# Patient Record
Sex: Male | Born: 1950 | Race: White | Hispanic: No | Marital: Married | Smoking: Current some day smoker
Health system: Southern US, Community
[De-identification: ages and names within clinical notes are randomized; demographics above are authoritative.]

## PROBLEM LIST (undated history)

## (undated) DIAGNOSIS — N183 Chronic kidney disease, stage 3 unspecified: Secondary | ICD-10-CM

## (undated) DIAGNOSIS — Z720 Tobacco use: Secondary | ICD-10-CM

## (undated) DIAGNOSIS — D573 Sickle-cell trait: Secondary | ICD-10-CM

## (undated) DIAGNOSIS — G4730 Sleep apnea, unspecified: Secondary | ICD-10-CM

## (undated) DIAGNOSIS — K449 Diaphragmatic hernia without obstruction or gangrene: Secondary | ICD-10-CM

## (undated) DIAGNOSIS — R5382 Chronic fatigue, unspecified: Secondary | ICD-10-CM

## (undated) DIAGNOSIS — M797 Fibromyalgia: Secondary | ICD-10-CM

## (undated) DIAGNOSIS — N181 Chronic kidney disease, stage 1: Secondary | ICD-10-CM

## (undated) DIAGNOSIS — G4700 Insomnia, unspecified: Secondary | ICD-10-CM

## (undated) DIAGNOSIS — C61 Malignant neoplasm of prostate: Secondary | ICD-10-CM

## (undated) DIAGNOSIS — E785 Hyperlipidemia, unspecified: Secondary | ICD-10-CM

## (undated) DIAGNOSIS — I129 Hypertensive chronic kidney disease with stage 1 through stage 4 chronic kidney disease, or unspecified chronic kidney disease: Secondary | ICD-10-CM

## (undated) DIAGNOSIS — E669 Obesity, unspecified: Secondary | ICD-10-CM

## (undated) HISTORY — DX: Chronic kidney disease, stage 3 (moderate): N18.3

## (undated) HISTORY — DX: Malignant neoplasm of prostate: C61

## (undated) HISTORY — DX: Tobacco use: Z72.0

## (undated) HISTORY — DX: Hyperlipidemia, unspecified: E78.5

## (undated) HISTORY — DX: Chronic kidney disease, stage 1: N18.1

## (undated) HISTORY — DX: Sickle-cell trait: D57.3

## (undated) HISTORY — DX: Obesity, unspecified: E66.9

## (undated) HISTORY — DX: Insomnia, unspecified: G47.00

## (undated) HISTORY — DX: Hypertensive chronic kidney disease with stage 1 through stage 4 chronic kidney disease, or unspecified chronic kidney disease: I12.9

## (undated) HISTORY — DX: Chronic fatigue, unspecified: R53.82

## (undated) HISTORY — DX: Chronic kidney disease, stage 3 unspecified: N18.30

## (undated) HISTORY — DX: Sleep apnea, unspecified: G47.30

## (undated) HISTORY — DX: Diaphragmatic hernia without obstruction or gangrene: K44.9

## (undated) HISTORY — DX: Fibromyalgia: M79.7

---

## 2002-09-08 DIAGNOSIS — C61 Malignant neoplasm of prostate: Secondary | ICD-10-CM

## 2002-09-08 HISTORY — DX: Malignant neoplasm of prostate: C61

## 2002-11-17 ENCOUNTER — Encounter: Admission: RE | Admit: 2002-11-17 | Discharge: 2002-11-17 | Payer: Self-pay | Admitting: Family Medicine

## 2002-11-17 ENCOUNTER — Encounter: Payer: Self-pay | Admitting: Family Medicine

## 2002-12-28 ENCOUNTER — Encounter: Admission: RE | Admit: 2002-12-28 | Discharge: 2002-12-28 | Payer: Self-pay | Admitting: Urology

## 2002-12-28 ENCOUNTER — Encounter: Payer: Self-pay | Admitting: Urology

## 2003-01-16 ENCOUNTER — Ambulatory Visit (HOSPITAL_COMMUNITY): Admission: RE | Admit: 2003-01-16 | Discharge: 2003-01-16 | Payer: Self-pay | Admitting: Gastroenterology

## 2003-01-27 ENCOUNTER — Inpatient Hospital Stay (HOSPITAL_COMMUNITY): Admission: RE | Admit: 2003-01-27 | Discharge: 2003-01-30 | Payer: Self-pay | Admitting: Urology

## 2003-01-27 ENCOUNTER — Encounter (INDEPENDENT_AMBULATORY_CARE_PROVIDER_SITE_OTHER): Payer: Self-pay | Admitting: Specialist

## 2005-01-31 ENCOUNTER — Observation Stay (HOSPITAL_COMMUNITY): Admission: RE | Admit: 2005-01-31 | Discharge: 2005-02-01 | Payer: Self-pay | Admitting: Urology

## 2006-03-27 ENCOUNTER — Encounter: Admission: RE | Admit: 2006-03-27 | Discharge: 2006-03-27 | Payer: Self-pay | Admitting: Cardiology

## 2006-03-30 ENCOUNTER — Ambulatory Visit (HOSPITAL_COMMUNITY): Admission: RE | Admit: 2006-03-30 | Discharge: 2006-03-30 | Payer: Self-pay | Admitting: Cardiology

## 2006-04-08 ENCOUNTER — Encounter: Admission: RE | Admit: 2006-04-08 | Discharge: 2006-04-08 | Payer: Self-pay | Admitting: Cardiology

## 2006-04-13 ENCOUNTER — Ambulatory Visit (HOSPITAL_COMMUNITY): Admission: RE | Admit: 2006-04-13 | Discharge: 2006-04-13 | Payer: Self-pay | Admitting: Cardiology

## 2006-06-16 ENCOUNTER — Ambulatory Visit: Payer: Self-pay | Admitting: Pulmonary Disease

## 2006-08-04 ENCOUNTER — Ambulatory Visit: Payer: Self-pay | Admitting: Pulmonary Disease

## 2007-05-04 ENCOUNTER — Encounter: Admission: RE | Admit: 2007-05-04 | Discharge: 2007-05-04 | Payer: Self-pay | Admitting: Family Medicine

## 2010-09-16 ENCOUNTER — Encounter
Admission: RE | Admit: 2010-09-16 | Discharge: 2010-09-16 | Payer: Self-pay | Source: Home / Self Care | Attending: Family Medicine | Admitting: Family Medicine

## 2011-01-24 NOTE — Op Note (Signed)
   NAME:  Arthur Hardy, Arthur Hardy NO.:  [id number]   MEDICAL RECORD NO.:  [id number]                   PATIENT TYPE:  AMB   LOCATION:  ENDO                                 FACILITY:  [facility]   PHYSICIAN:  James L. Malon Kindle., M.D.          DATE OF BIRTH:  [birth year]   DATE OF PROCEDURE:  01/16/2003  DATE OF DISCHARGE:                                 OPERATIVE REPORT   PROCEDURE:  Esophagogastroduodenoscopy.   INDICATIONS FOR PROCEDURE:  Chronic esophageal reflux without any previous  endoscopy.   DESCRIPTION OF PROCEDURE:  The procedure had been explained to the patient  and consent obtained. With the patient in the left lateral decubitus  position, the Olympus scope was inserted and advanced. The stomach was  entered, pylorus identified and passed. The duodenum including the bulb and  second portion were seen well and was entirely normal. The scope was  withdrawn back in the bulb, the bulb was normal with no ulceration or  inflammation. The pyloric channel was normal. The antrum and body of the  stomach were seen well and were normal with no ulceration. The fundus and  cardia were seen in the retroflexed view and were normal. The diaphragmatic  hiatus was located 44 cm and the Z line was clearly identifiable at 36 cm.  The patient thus had an 8 cm hiatal hernia. The distal esophagus was  endoscopically normal, no evidence of Barrett's esophagus, ulceration, etc.  The proximal esophagus was seen well upon removal and seen to be normal. The  patient tolerated the procedure well.   ASSESSMENT:  Hiatal hernia and gastroesophageal reflux disease without any  evidence of Barrett's esophagus on endoscopy.   PLAN:  1. Will proceed with colonoscopy at this time as planned.  2. Will continue on the Prilosec.  3. Will give a reflux sheet.  4. See back in the office in six months for followup and discuss further     options.            James L. Malon Kindle., M.D.    Waldron Session  D:  01/16/2003  T:  01/16/2003  Job:  [id number]   cc:   Donia Guiles, M.D.  [address] [place]  [place] [address]  Fax: [phone]

## 2011-01-24 NOTE — Discharge Summary (Signed)
NAME:  Arthur Hardy, Arthur Hardy NO.:  [id number]   MEDICAL RECORD NO.:  [id number]                   PATIENT TYPE:  INP   LOCATION:  0372                                 FACILITY:  [facility]   PHYSICIAN:  Mark C. Vernie Ammons, M.D.               DATE OF BIRTH:  [birth year]   DATE OF ADMISSION:  01/27/2003  DATE OF DISCHARGE:  01/30/2003                                 DISCHARGE SUMMARY   PRINCIPAL DIAGNOSIS:  Adenocarcinoma of the prostate.   OTHER DIAGNOSES:  1. Postoperative anemia.  2. Gastroesophageal reflux.  3. Fibromyalgia.   MAJOR OPERATION:  Radical retropubic prostatectomy and bilateral lymph node  dissection (pathology pending).   DISCHARGE MEDICATIONS:  1. Will be unchanged from admission, which include:     A. Protonix 20 mg.     B. Nortriptyline 75 mg h.s.     C. Fluoxetine 40 mg.  2. In addition to that he will be given:     A. Prescription for Levaquin 250 mg to be taken once a day, #16.     B. Tylox for pain, #48.     C. Colace 100 mg q.12h., #30.   FOLLOW-UP:  Will be in my office in one week for skin staple removal.   ACTIVITY:  Will be limited to no heavy lifting, vigorous activity, up and  down stairs, or driving.   DIET:  Will be unrestricted.   CONDITION UPON DISCHARGE:  Improved, stable.   BRIEF HISTORY:  The patient is a 60 year old white male who was seen for an  elevated PSA of 6.4.  His prostate was subtlely irregular but not suspicious  to exam.  He was a transrectal ultrasound and biopsy of his prostate which  revealed a 26-gram prostate that had focal glanular atypia suspicious for  microscopic focus of carcinoma on the right side.  The left side had  definite adenocarcinoma, Gleason score 6 in 10% of the biopsy.  He was  admitted for elective radical retropubic prostatectomy.  The remainder of  his history and physical is noted on the chart and will not be redictated  here.   HOSPITAL COURSE:  On Jan 27, 2003, he  underwent radical retropubic  prostatectomy and bilateral pelvic lymph node dissection with no  complication.  Postoperatively his hemoglobin and hematocrit had fallen  slightly to 10 and 29 respectively.  The following day they were 9.6 and 28,  where they have remained stable.  He is not having any dizziness or  orthostasis.   Throughout his hospital course he did well, with starting clear liquids the  night of his surgery and advancing his diet to a regular diet, which he is  currently tolerating.  He has had flatus but yet to have a good bowel  movement.  His abdomen is otherwise soft and nontender.   His drain had minimal output through his hospitalization and was, therefore,  removed  on the day of his discharge.  His wound appears to be healing well,  with no signs of infection.  He has skin staples in which will be removed in  my office.  I told him I would contact him with the results of his pathology  report.  He is otherwise felt ready for discharge at this time.                                               Mark C. Vernie Ammons, M.D.    MCO/MEDQ  D:  01/30/2003  T:  01/30/2003  Job:  [id number]   cc:   Donia Guiles, M.D.  [address] [place]  [place] [address]  Fax: [phone]

## 2011-01-24 NOTE — Op Note (Signed)
NAME:  Arthur Hardy, Arthur Hardy NO.:  [id number]   MEDICAL RECORD NO.:  [id number]          PATIENT TYPE:  AMB   LOCATION:  DAY                          FACILITY:  [facility]   PHYSICIAN:  Mark C. Vernie Ammons, M.D.  DATE OF BIRTH:  1951-05-04   DATE OF PROCEDURE:  01/31/2005  DATE OF DISCHARGE:                                 OPERATIVE REPORT   PREOPERATIVE DIAGNOSIS:  Erectile dysfunction.   POSTOPERATIVE DIAGNOSIS:  Erectile dysfunction.   PROCEDURE:  Implantation of a three-piece AMS penile prosthesis.   ATTENDING SURGEON:  Ihor Gully, MD   RESIDENT SURGEON:  Rhae Lerner, MD   ANESTHESIA:  General endotracheal anesthesia.   COMPLICATIONS:  None.   INDICATIONS FOR PROCEDURE:  Arthur Hardy is a 60 year old male, who is status  post radical retropubic prostatectomy for prostate cancer in November 2004.  Following surgery, the patient regained his urinary continence; however, he  has continued to have problems with erectile dysfunction.  The patient was  initially treated with oral PDE-5 inhibitors; however, he was unable to  obtain an erection sufficient for intercourse.  Discussion was subsequently  held with Arthur Hardy concerning treatment options including intracavernosal  injection therapy and placement of  a penile prosthesis. The patient  initially elected to try and intracavernosal injections; however, these were  also unsuccessful at maximum dosage.  Arthur Hardy has now elected to proceed  with placement of a penile prosthesis.  All risks inherent to this procedure  have been explained to him, and he has voiced his understanding.   PROCEDURE IN DETAIL:  The patient was brought to the operating room  following induction of general endotracheal anesthesia, was placed in the  supine position and prepped and draped in the usual sterile fashion.  An 81-  [place] Foley catheter was placed to straight drain and the bladder  completely emptied.  Midline scrotal  incision was subsequent performed and  dissection carried down to the dartos muscle and the subcutaneous tissues to  the level of the corporal bodies.  Each corporal body was cleaned of all  adherent tissues.  A [place] retractor was placed and the hooks set for  maximal exposure of the ventral surface of the corporal bodies.  Two  parallel interrupted 2-0 Vicryl sutures were subsequently placed in each  corporal body and bilateral corporotomy incisions created.  The right  corporal body was first dilated up to a size 14 through the corporotomy  incisions.  During dilation, there was no resistance encountered, and the  corporal body was easily dilated up to a size 14.  This process was repeated  in the left corporal body and again, the space dilated up to a size 14  without difficulty.  At this point, a measuring tool was used to determine  the length of the corpora, and each was noted to be approximately 19 cm.  Decision was subsequent made to use an 18 cm prosthesis with 1 cm rear tip  extenders.  While the prosthesis was being prepared, a tunnel was created  from the right side of the scrotal incision up  through the rectus fascia  just above the pubic bone and a space created in the retropubic area.  The  65 mL reservoir was subsequently passed up into this space and inflated to  confirm position.  At this point, the prosthesis was ready for implantation.  Each cylinder was passed down through the penis using a Furlough tool.  The  proximal portion of the cylinders were then inserted into the proximal  corporal spaces via the corporotomy incision.  The cylinders were subsequent  inflated and with a minor amount of manipulation, they were able to be  completely straightened and were noted to produce a nice erection.  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  At this point, penile modeling was performed, thus straightening  the penis to  a point where erection would be ideal for intercourse.  The  corporal cylinders were subsequently deflated and the corporotomy incisions  closed with running 2-0 Vicryl suture, taking care to avoid injury to the  underlying prosthesis apparatus.  Once the corporotomy incisions had been  closed, a subcutaneous pocket was created in the right dependent scrotum and  the pump placed within this pocket.  All tubing was subsequently connected  using the quick-connect equipment and the prosthesis tested a final time to  confirm appropriate function.  The incision was subsequently closed in three  layers.  The first layer reapproximated the loose tissues immediately  surrounding the tubing.  This was performed using a running 2-0 Vicryl  suture.  The dartos muscle was subsequently reapproximated using a running 2-  0 Vicryl suture.  The skin was then reapproximated using a running 3-0  chromic suture.  The strings from the prosthesis were subsequently cut away  from the glans penis.  Dermabond was applied to the scrotal incision, and a  conform dressing placed around the penis.  A scrotal support and fluff  dressing were subsequently placed over the scrotum.  The patient was allowed  to awaken, and the case was ended.  The patient tolerated the procedure  well, and there no complications.  Please note that Dr. Ihor Gully was  present for the entire case, participated in all aspects of the procedure.      JP/MEDQ  D:  01/31/2005  T:  01/31/2005  Job:  [id number]

## 2011-01-24 NOTE — Assessment & Plan Note (Signed)
[facility]                             PULMONARY OFFICE NOTE   [name], [name]                    MRN:          [id number]  DATE:08/04/2006                            DOB:          10-08-1950    PULMONARY FOLLOWUP VISIT   I saw Mr. Arthur Hardy in followup today for his severe obstructive sleep  apnea.   He is currently on CPAP at 7 cm of water using, using nasal pillows, and  heat and humidification.  He appears to be doing quite well with this.  He says that he is not really having any difficulty as far as using the  mask or the machine and he feels his sleep quality has improved  considerably.  He has also noticed that he has much more energy during  the day.  He says this has been particularly true over the last 2-3  weeks where he seems to have gotten much more comfortable using his mask  and machine.   He still continues to smoker cigars and says that he is not interested  in smoking cessation at the present time.   I had also discussed with him the importance of diet, exercise, and  weight reduction, and he says that now that he has an increased energy  level during the day he does feel like he would be able to undergo an  exercise program.   Otherwise, I have encouraged him to maintain his compliance with his  CPAP therapy and I would plan on following up with him in approximately  6 months.     Coralyn Helling, MD  Electronically Signed    VS/MedQ  DD: 08/04/2006  DT: 08/05/2006  Job #: [id number]   cc:   Armanda Magic, M.D.  Areatha Keas, M.D.

## 2011-01-24 NOTE — Op Note (Signed)
   NAME:  TODD, JELINSKI NO.:  [id number]   MEDICAL RECORD NO.:  [id number]                   PATIENT TYPE:  AMB   LOCATION:  ENDO                                 FACILITY:  [facility]   PHYSICIAN:  James L. Malon Kindle., M.D.          DATE OF BIRTH:  06/08/1951   DATE OF PROCEDURE:  01/16/2003  DATE OF DISCHARGE:                                 OPERATIVE REPORT   PROCEDURE:  Colonoscopy.   MEDICATIONS:  The patient received a total of fentanyl 87.5 mcg and Versed 8  mg IV for both procedures.   SCOPE:  Olympus pediatric scope.   INDICATIONS FOR PROCEDURE:  Colon cancer screening.   DESCRIPTION OF PROCEDURE:  Following endoscopy, the patient was turned  around and the Olympus scope inserted and advanced. This had all been  discussed with the patient on previous office visit. The prep was excellent.  We were able to advance to the cecum without difficulty. The ileocecal valve  and appendiceal orifice were seen. The scope was withdrawn and the cecum,  ascending colon, transverse colon, descending and sigmoid colon were seen  well upon removal and no polyps or other lesions were seen. The scope was  withdrawn. The patient tolerated the procedure well, the rectum was free of  polyps.   ASSESSMENT:  Normal screening colonoscopy.   PLAN:  Routine followup with consideration for another colonoscopy in 10  years and yearly Hemoccults.                                               James L. Malon Kindle., M.D.    Waldron Session  D:  01/16/2003  T:  01/16/2003  Job:  [id number]   cc:   Donia Guiles, M.D.  [address] [place]  [place] [address]  Fax: [phone]

## 2011-01-24 NOTE — Op Note (Signed)
NAME:  Arthur Hardy, Arthur Hardy NO.:  [id number]   MEDICAL RECORD NO.:  [id number]                   PATIENT TYPE:  INP   LOCATION:  0004                                 FACILITY:  [facility]   PHYSICIAN:  Mark C. Vernie Ammons, M.D.               DATE OF BIRTH:  08/08/1951   DATE OF PROCEDURE:  01/27/2003  DATE OF DISCHARGE:                                 OPERATIVE REPORT   PREOPERATIVE DIAGNOSIS:  Prostate cancer.   POSTOPERATIVE DIAGNOSIS:  Prostate cancer.   OPERATION/PROCEDURE:  1. Bilateral pelvic lymph node dissection.  2. Radical retropubic prostatectomy.   SURGEON:  Mark C. Vernie Ammons, M.D.   ASSISTANT:  Melvyn Novas, M.D.   ANESTHESIA:  General.   ESTIMATED BLOOD LOSS:  1200 mL.   SPECIMENS:  1. Bilateral pelvic lymph nodes.  2. Prostate.  3. Seminal vesicles.   DRAINS:  1. Foley catheter.  2. Jackson-Pratt drain.   COMPLICATIONS:  None.   BRIEF HISTORY:  Mr. Feagans is a 60 year old white male with a recent 3 + 3  = 6 prostate cancer on the left with a clinical stage of T1C and a  preoperative PSA of 6.4.  After discussion of the treatment options, he had  opted for bilateral pelvic lymph node dissection and radical retropubic  prostatectomy.   DESCRIPTION OF PROCEDURE:  Following administration of IV antibiotics and  general anesthesia, Mr. Sheckler was prepped and draped in the supine  position.  A Foley catheter was placed.  A midline lower abdominal incision  was made extending inferiorly from the pubic symphysis up to a point of 3 cm  inferior to the umbilicus using a skin knife.  Bovie cautery was used to  dissect through the underlying fatty layer along the length of the incision  down to the rectus fascia.  The rectus fascia was opened in the midline  throughout the length of the incision.  The pyramidalis was split in its  midline.  The bellies of the rectus muscles were retracted bilaterally using  a Buchwalter retractor.   We  began by sweeping the soft tissues off the pelvic sidewall from the  symphysis up to the level of the vas deferens.  Both iliac veins as well as  the underlying obturator nerves were identified in this fashion.  We then  began a lymph node dissection.  Beginning with the patient's right side, the  adventitial layer over the iliac vein was opened sharply using Metzenbaum  scissors.  This exposed the underlying obturator node packet which was  systematically dissected off the vein and sidewalls as well as the  underlying obturator nerve both bluntly and sharply using clips.  The entire  obturator node packet was removed in its entirety.  The obturator nerve was  intact.  This was repeated on the patient's left side.  The nodes were  palpably normal bilaterally and were sent for permanent section analysis.  We then turned our attention to the prostate gland.  The bladder was  retracted upward using a bladder blade.  Soft tissues were gently cleaned  off the endopelvic fascia laterally to the prostate bilaterally using the  Kitner.  The endopelvic fascia was then sharply incised along the sides of  the prostate gland.  This allowed the surgeon to put his fingers around each  end of the prostate.  We then identified the puboprostatic ligaments which  were taken down sharply.  At that point a Hohenfellner right-angle clamp was  passed underneath the dorsal venous complex above the urethra and dorsal  venous complex was tied off with #1 Vicryls.  This was performed twice  distally.  We then placed a backbleeding suture ligation stitch over the  prostatic apex.  The Hohenfellner was then again passed underneath the  dorsal venous complex which was divided using Bovie cautery.  We then passed  the Hohenfellner beneath the urethra while sparing the neurovascular bundles  which were seen coursing lateral to the urethra.  The anterior and lateral  urethra was then divided over the Hohenfellner  sharply.  This allowed [place] to  cut the distal end of the catheter at the penile meatus and pull it out  through the surgical wound exposing the posterior aspect of the urethra.  A  vascular tape had been passed underneath the posterior aspect.  The urethra  was then divided sharply using Metzenbaum scissors.  The underlying  rectourethralis fibers were also divided.  This allowed [place] to bluntly  dissect the plane between the rectum and the prostate inferiorly and from  that point the lateral pedicles of the prostate were taken down using right-  angle dissection and clips bilaterally.  This allowed [place] to completely track  the prostate upward and to identify the layers of Denonvilliers fascia over  the posterior prostate.  This was opened up exposing the underlying  ampullary of the vas and the seminal vesicles.  These were systematically  dissected out.  The seminal vesicles were dissected out and clipped such  that they remained attached to the prostate specimen.  The vas deferens were  dissected out, clipped and divided.  At that point the prostate remained  attached at the bladder neck.  The bladder neck was dissected out using  Vanderbilts and Bovie cautery.  This allowed [place] to isolate the bladder neck.  The remaining attachments of the prostatic pedicles posteriorly were clipped  and divided and the prostate and seminal vesicles were removed from the  field.  The bladder neck was everted using interrupted 4-0 Vicryl sutures.  The bladder neck was reconstructed in the tennis racquet fashion with the  running 2-0 chromic at 6 o'clock.  Both ureteral orifices were identified  and intact.   At that point we turned our attention to the urethral stump and pelvis.  There were several areas of venous oozing, which were suture ligated.  There  was also a small arterial vessel inferior to the urethra which was suture ligated.  Foley catheter was placed through the penis such that it was now   draining from the urethral stump.  The bladder neck-urethral stump  anastomosis was performed at five points using interrupted #1 Vicryls.  The  end of the catheter was sutured to a #0 Prolene which was brought out  through a small puncture wound in the anterior bladder and would later be  brought out through the right lower quadrant and sewn to a  button.  Following the anastomosis, the bladder was irrigated.  There was no evidence  of leak.  The entire wound was then irrigated.  The JP drain was placed near  the anastomosis and brought out through a separate stab wound in the left  lower quadrant.  The rectus fascia was closed using a running #1 PDS suture.  The skin was then closed after irrigation using skin staples.   DISPOSITION:  The patient was taken to the recovery room in stable  condition.      Melvyn Novas, M.D.                      Veverly Fells. Vernie Ammons, M.D.    DK/MEDQ  D:  01/27/2003  T:  01/27/2003  Job:  [id number]

## 2011-01-24 NOTE — Op Note (Signed)
NAME:  Arthur Hardy, FAULKENBERRY NO.:  [id number]   MEDICAL RECORD NO.:  [id number]          PATIENT TYPE:  OIB   LOCATION:  2853                         FACILITY:  [facility]   PHYSICIAN:  Armanda Magic, M.D.     DATE OF BIRTH:  06/30/1951   DATE OF PROCEDURE:  03/30/2006  DATE OF DISCHARGE:  03/30/2006                                 OPERATIVE REPORT   PROCEDURE:  Left heart catheterization, coronary angiography, left  ventriculography.   OPERATOR:  Armanda Magic, M.D.   INDICATIONS:  Shortness of breath and abnormal Cardiolite.   COMPLICATIONS:  None.   IV ACCESS:  Via right femoral artery 6 French sheath.   This is a very pleasant 60 year old white male who has been having severe  exertional shortness of breath and underwent stress Cardiolite study which  showed multiple perfusion defects.  He now presents for cardiac  catheterization.   The patient was brought to the cardiac catheterization laboratory in the  fasting nonsedated state.  An informed consent was obtained.  The patient  was connected to continuous heart rate and pulse oximetry monitoring and  intermittent blood pressure monitoring.  The right groin was prepped and  draped in a sterile fashion.  Xylocaine 1% was used for local anesthesia.  Using the modified Seldinger technique, a 6-French sheath was placed in the  right femoral artery.  Under fluoroscopic guidance, a 6-French JL4 catheter  was placed in the left coronary artery.  Multiple cine films were taken at  30 RAO and 40 degree LAO views.  The catheter was then exchanged out over a  guide wire for a 6-French JR4 catheter just placed under fluoroscopic  guidance in the right coronary artery.  The catheter could not adequately be  engaged and it was exchanged out for a 6-French Noto right coronary artery  catheter which successfully engaged the right coronary artery.  Multiple  cine films were taken in 30 degree RAO and 40 degree LAO views.  The  catheter was then exchanged out over a guide wire for a 6-French angled  pigtail catheter which was placed under fluoroscopic guidance in the left  ventricular cavity.  Left ventriculography was performed.  In the 30 degree  RAO view using a total of 30 mL of contrast at 15 mL per second, the  catheter was then pulled back across the aortic valve with no significant  gradient noted.  At the end of the procedure, all catheters and sheaths were  removed.  Manual compression was performed until adequate hemostasis was  obtained.  The patient was transferred back to room in stable condition.   RESULTS:  1.  Left main coronary artery is widely patent and trifurcates in a left      anterior descending artery, left circumflex artery and ramus branch.  2.  The left anterior descending is widely patent throughout its course to      the apex, giving rise to two diagonal branches, both of which are widely      patent.  3.  The left circumflex is widely patent throughout it course, giving rise  to four obtuse marginal branches and then terminates in a posterior      descending artery branch, all of which are widely patent.  4.  The ramus is a rather large branch which bifurcates distally to two      daughter branches and is widely patent.  5.  The right coronary artery is nondominant and is small, but widely      patent.  It gives off one acute RV marginal branch.   Left ventriculography shows normal LV systolic function at 55%, LV pressure  121/5 mmHg, aortic pressure 121/64 mmHg, LVEDP 15 mmHg, aortic valve  gradient zero.   ASSESSMENT:  1.  Dyspnea on exertion of unclear etiology, noncardiac.  2.  Abnormal Cardiolite stress imaging most likely secondary to obesity.  3.  Normal coronary arteries.  4.  Normal left ventricular function including normal left ventricular end-      diastolic pressure.   PLAN:  Discharge to home after IV fluid and bedrest.  Outpatient PFTs.  Chest CT to rule  out PE and sleep study.  He will follow up with me in two  weeks.      Armanda Magic, M.D.  Electronically Signed     TT/MEDQ  D:  03/31/2006  T:  03/31/2006  Job:  [id number]   cc:   Donia Guiles, M.D.

## 2011-01-24 NOTE — Assessment & Plan Note (Signed)
[facility]                               PULMONARY OFFICE NOTE   NAME:Arthur Hardy, Arthur Hardy                    MRN:          [id number]  DATE:06/16/2006                            DOB:          [birth year]    I had the pleasure of meeting Arthur Hardy with his wife today for evaluation  of his sleep difficulties. He had undergone a diagnostic polysomnogram on  April 17, 2006, and this showed evidence for severe obstructive sleep apnea  with an apnea-hypopnea index of 58 and oxygen saturation rate of 79%. He  subsequently underwent a CPAP titration study on May 08, 2006 and was  titrated to a CPAP pressure setting of 7-cm of water with a reduction in his  apnea-hypopnea index to 0. He was originally referred for his sleep test as  part of his evaluation for feeling tired and dyspneic. Although it appears  in question that his main difficulties were with feeling tired. He has been  seen by Dr. Phylliss Bob for the last several years for evaluation and treatment of  fibromyalgia and chronic fatigue syndrome. His current sleep pattern is that  he will go bed between 10-11 o'clock at night, although he is frequently  falling asleep prior to this. He will take a nap after work anywhere from 30  minutes to one hour, although sometimes it can be even longer than this. He  is also falling asleep while watching TV as well as reading and he had had a  couple of instances in which he has fallen asleep while talking to somebody  as well. He falls asleep fairly quickly. He will wake up several times  during the night, but then is able to fall back asleep easily after that. He  gets up at about 5 in the morning to get ready to go to work. He says that  once he wakes up he feels like he has not slept at all and is very tired and  he will occasionally get a headache in the morning as well. His wife says  that he does snore at night and she has seen him stop breathing as well  as  wake up with a choking feeling and he tosses and turns quite a bit at night.  He also notices that he has dry mouth at night and tends to breath more  through his mouth at night as well. He drinks about three cups of coffee in  the morning and two cups of iced tea at night. He is currently on Prozac  which he takes in the morning and nortriptyline which he takes at night. He  states that his nortriptyline is to help with his sleep pattern. There is no  history of sleepwalking, sleep talking, nightmares, night terrors or  restless leg syndrome. He also denies any symptoms of sleep hallucinations,  sleep paralysis or cataplexy. He says that he did feel that he slept better  during his titration study and he did not feel the need to take a nap after  he had his titration study. He is not currently on  CPAP therapy.   PAST MEDICAL HISTORY:  Is significant for:  1. Hypertension.  2. Fibromyalgia.  3. Chronic fatigue syndrome.  4. Gastroesophageal reflux disease.  5. Prostate cancer, status-post resection in May 2004. He has had an      implant in May 2006.  6. Tonsillectomy.   CURRENT MEDICATIONS:  1. Fluoxetine 40 mg q.d.  2. Omeprazole 20 mg b.i.d.  3. Hydrochlorothiazide 25 mg q.d.  4. Aspirin 81 mg q.d.  5. Lipitor 20 mg q.d.  6. Nortriptyline 75 mg q h.s.   ALLERGIES:  No known drug allergies.   FAMILY HISTORY:  Is significant for his parents who had heart disease. His  mother had breast cancer and he has a brother who snores.   SOCIAL HISTORY:  He is married. Lives with his wife. Works as a Curator. He  smokes two cigars a day. He has no history of cigarette use. He denies  significant alcohol use.   REVIEW OF SYSTEMS:  He had undergone pulmonary function tests on April 13, 2006, which were essentially normal. He apparently had undergone a CT scan  of his chest, which the patient reports he was told he had changes of  emphysema, although he does not have this available  for my review and he had  undergone a left heart catheterization which apparently was essentially  normal as well.   PHYSICAL EXAMINATION:  He is 5'10 tall. Weight: 215 lb. Temperature: 98.0.  Blood pressure: 120/82. Heart rate: 87. Oxygen saturation is 96% on room  air.  HEENT: Pupils reactive. Extraocular muscles intact. There is no sinus  tenderness. He has a Mallampati 2 airway with a triangular-shaped uvula and  a decreased AP diameter of the oropharynx. There is no lymphadenopathy. No  thyromegaly.  HEART: Was S1, S2, regular rate and rhythm.  CHEST: Clear to auscultation.  ABDOMEN: Soft and nontender. Positive bowel sounds.  EXTREMITIES: There was no edema, cyanosis or clubbing.  NEUROLOGIC: He is alert, oriented x 3; 5/5 strength and no cerebellar  deficits  were appreciated.   IMPRESSION:  1. Severe obstructive sleep apnea as demonstrated by apnea-hypopnea index      of 58 and oxygen saturation rate of 79%. I read the results of his      sleep study with him in detail. I discussed the adverse consequences of      untreated sleep apnea including increased risk of hypertension,      coronary artery disease, cerebral vascular disease and diabetes. I      discussed with him the importance of diet, exercise and weight      reduction as well as avoiding tobacco, alcohol and sedatives. Driving      precautions were discussed with him as well. I reviewed various      treatment options with him for his sleep apnea including CPAP therapy,      oral appliances __________. Given the severity of his sleep apnea as      well as his apparent initial tolerance of CPAP therapy during his      titration study, I would recommend CPAP therapy for him. We will      therefore arrange for him to be initiated on CPAP at 7-cm of water with      heated humidification and given the fact that he has a beard and     mustache, he may be best suited with using nasal pillows. I had also      discussed  with him that by improving his sleep quality this may      hopefully have beneficial effects on his fibromyalgia and chronic      fatigue syndrome. I had also discussed with him various techniques to      try and help him acclimatize to use the CPAP therapy.  2. Tobacco abuse. I discussed with him the importance of smoking cessation      particularly with regards to his sleep difficulties. I had explained to      him that nicotine can act as a stimulant and this may be contributing      to some of his sleep disruption.   My plan will be to follow-up with him in approximately 6-8 weeks to assess  his tolerance and compliance to CPAP therapy.       Coralyn Helling, MD      VS/MedQ  DD:  06/16/2006  DT:  06/17/2006  Job #:  [id number]   cc:   Armanda Magic, M.D.  Areatha Keas, M.D.

## 2011-06-11 ENCOUNTER — Inpatient Hospital Stay: Payer: Self-pay | Admitting: Surgery

## 2011-06-11 DIAGNOSIS — R072 Precordial pain: Secondary | ICD-10-CM

## 2014-05-17 DIAGNOSIS — IMO0001 Reserved for inherently not codable concepts without codable children: Secondary | ICD-10-CM | POA: Diagnosis not present

## 2014-05-17 DIAGNOSIS — G9332 Myalgic encephalomyelitis/chronic fatigue syndrome: Secondary | ICD-10-CM | POA: Diagnosis not present

## 2014-05-17 DIAGNOSIS — I129 Hypertensive chronic kidney disease with stage 1 through stage 4 chronic kidney disease, or unspecified chronic kidney disease: Secondary | ICD-10-CM | POA: Diagnosis not present

## 2014-05-17 DIAGNOSIS — E7800 Pure hypercholesterolemia, unspecified: Secondary | ICD-10-CM | POA: Diagnosis not present

## 2014-05-17 DIAGNOSIS — Z0000 Encounter for general adult medical examination without abnormal findings: Secondary | ICD-10-CM | POA: Diagnosis not present

## 2014-05-17 DIAGNOSIS — E782 Mixed hyperlipidemia: Secondary | ICD-10-CM | POA: Diagnosis not present

## 2014-05-17 DIAGNOSIS — Z8546 Personal history of malignant neoplasm of prostate: Secondary | ICD-10-CM | POA: Diagnosis not present

## 2014-05-17 DIAGNOSIS — R5382 Chronic fatigue, unspecified: Secondary | ICD-10-CM | POA: Diagnosis not present

## 2014-05-17 DIAGNOSIS — N183 Chronic kidney disease, stage 3 unspecified: Secondary | ICD-10-CM | POA: Diagnosis not present

## 2014-05-17 DIAGNOSIS — Z23 Encounter for immunization: Secondary | ICD-10-CM | POA: Diagnosis not present

## 2014-05-17 DIAGNOSIS — Z125 Encounter for screening for malignant neoplasm of prostate: Secondary | ICD-10-CM | POA: Diagnosis not present

## 2014-06-05 DIAGNOSIS — Z1211 Encounter for screening for malignant neoplasm of colon: Secondary | ICD-10-CM | POA: Diagnosis not present

## 2014-06-05 DIAGNOSIS — K5730 Diverticulosis of large intestine without perforation or abscess without bleeding: Secondary | ICD-10-CM | POA: Diagnosis not present

## 2014-12-08 DIAGNOSIS — N183 Chronic kidney disease, stage 3 (moderate): Secondary | ICD-10-CM | POA: Diagnosis not present

## 2014-12-08 DIAGNOSIS — E669 Obesity, unspecified: Secondary | ICD-10-CM | POA: Diagnosis not present

## 2014-12-08 DIAGNOSIS — G4700 Insomnia, unspecified: Secondary | ICD-10-CM | POA: Diagnosis not present

## 2014-12-08 DIAGNOSIS — G4733 Obstructive sleep apnea (adult) (pediatric): Secondary | ICD-10-CM | POA: Diagnosis not present

## 2014-12-08 DIAGNOSIS — Z720 Tobacco use: Secondary | ICD-10-CM | POA: Diagnosis not present

## 2014-12-08 DIAGNOSIS — Z8546 Personal history of malignant neoplasm of prostate: Secondary | ICD-10-CM | POA: Diagnosis not present

## 2014-12-08 DIAGNOSIS — M797 Fibromyalgia: Secondary | ICD-10-CM | POA: Diagnosis not present

## 2014-12-08 DIAGNOSIS — I129 Hypertensive chronic kidney disease with stage 1 through stage 4 chronic kidney disease, or unspecified chronic kidney disease: Secondary | ICD-10-CM | POA: Diagnosis not present

## 2014-12-08 DIAGNOSIS — Z6832 Body mass index (BMI) 32.0-32.9, adult: Secondary | ICD-10-CM | POA: Diagnosis not present

## 2014-12-08 DIAGNOSIS — E782 Mixed hyperlipidemia: Secondary | ICD-10-CM | POA: Diagnosis not present

## 2014-12-08 DIAGNOSIS — R5382 Chronic fatigue, unspecified: Secondary | ICD-10-CM | POA: Diagnosis not present

## 2015-07-10 DIAGNOSIS — Z720 Tobacco use: Secondary | ICD-10-CM | POA: Diagnosis not present

## 2015-07-10 DIAGNOSIS — Z8546 Personal history of malignant neoplasm of prostate: Secondary | ICD-10-CM | POA: Diagnosis not present

## 2015-07-10 DIAGNOSIS — I129 Hypertensive chronic kidney disease with stage 1 through stage 4 chronic kidney disease, or unspecified chronic kidney disease: Secondary | ICD-10-CM | POA: Diagnosis not present

## 2015-07-10 DIAGNOSIS — E782 Mixed hyperlipidemia: Secondary | ICD-10-CM | POA: Diagnosis not present

## 2015-07-10 DIAGNOSIS — Z23 Encounter for immunization: Secondary | ICD-10-CM | POA: Diagnosis not present

## 2015-07-10 DIAGNOSIS — N183 Chronic kidney disease, stage 3 (moderate): Secondary | ICD-10-CM | POA: Diagnosis not present

## 2015-07-10 DIAGNOSIS — R5382 Chronic fatigue, unspecified: Secondary | ICD-10-CM | POA: Diagnosis not present

## 2015-07-10 DIAGNOSIS — Z0000 Encounter for general adult medical examination without abnormal findings: Secondary | ICD-10-CM | POA: Diagnosis not present

## 2015-07-10 DIAGNOSIS — M797 Fibromyalgia: Secondary | ICD-10-CM | POA: Diagnosis not present

## 2015-07-10 DIAGNOSIS — G4700 Insomnia, unspecified: Secondary | ICD-10-CM | POA: Diagnosis not present

## 2015-07-10 DIAGNOSIS — G4733 Obstructive sleep apnea (adult) (pediatric): Secondary | ICD-10-CM | POA: Diagnosis not present

## 2015-07-10 DIAGNOSIS — E669 Obesity, unspecified: Secondary | ICD-10-CM | POA: Diagnosis not present

## 2015-07-17 DIAGNOSIS — N179 Acute kidney failure, unspecified: Secondary | ICD-10-CM | POA: Diagnosis not present

## 2015-07-17 DIAGNOSIS — N183 Chronic kidney disease, stage 3 (moderate): Secondary | ICD-10-CM | POA: Diagnosis not present

## 2015-08-06 DIAGNOSIS — N179 Acute kidney failure, unspecified: Secondary | ICD-10-CM | POA: Diagnosis not present

## 2015-08-06 DIAGNOSIS — N183 Chronic kidney disease, stage 3 (moderate): Secondary | ICD-10-CM | POA: Diagnosis not present

## 2015-09-25 ENCOUNTER — Other Ambulatory Visit: Payer: Self-pay | Admitting: Family Medicine

## 2015-09-25 DIAGNOSIS — R109 Unspecified abdominal pain: Secondary | ICD-10-CM | POA: Diagnosis not present

## 2015-09-25 DIAGNOSIS — Z9049 Acquired absence of other specified parts of digestive tract: Secondary | ICD-10-CM | POA: Diagnosis not present

## 2015-09-25 DIAGNOSIS — K760 Fatty (change of) liver, not elsewhere classified: Secondary | ICD-10-CM | POA: Diagnosis not present

## 2015-10-02 ENCOUNTER — Other Ambulatory Visit: Payer: Managed Care, Other (non HMO)

## 2016-01-17 DIAGNOSIS — Z6832 Body mass index (BMI) 32.0-32.9, adult: Secondary | ICD-10-CM | POA: Diagnosis not present

## 2016-01-17 DIAGNOSIS — I129 Hypertensive chronic kidney disease with stage 1 through stage 4 chronic kidney disease, or unspecified chronic kidney disease: Secondary | ICD-10-CM | POA: Diagnosis not present

## 2016-01-17 DIAGNOSIS — L309 Dermatitis, unspecified: Secondary | ICD-10-CM | POA: Diagnosis not present

## 2016-01-17 DIAGNOSIS — E782 Mixed hyperlipidemia: Secondary | ICD-10-CM | POA: Diagnosis not present

## 2016-01-17 DIAGNOSIS — M797 Fibromyalgia: Secondary | ICD-10-CM | POA: Diagnosis not present

## 2016-01-17 DIAGNOSIS — E669 Obesity, unspecified: Secondary | ICD-10-CM | POA: Diagnosis not present

## 2016-01-17 DIAGNOSIS — G4733 Obstructive sleep apnea (adult) (pediatric): Secondary | ICD-10-CM | POA: Diagnosis not present

## 2016-01-17 DIAGNOSIS — R5382 Chronic fatigue, unspecified: Secondary | ICD-10-CM | POA: Diagnosis not present

## 2016-01-17 DIAGNOSIS — G4700 Insomnia, unspecified: Secondary | ICD-10-CM | POA: Diagnosis not present

## 2016-01-17 DIAGNOSIS — N183 Chronic kidney disease, stage 3 (moderate): Secondary | ICD-10-CM | POA: Diagnosis not present

## 2016-01-17 DIAGNOSIS — Z720 Tobacco use: Secondary | ICD-10-CM | POA: Diagnosis not present

## 2016-08-26 DIAGNOSIS — E782 Mixed hyperlipidemia: Secondary | ICD-10-CM | POA: Diagnosis not present

## 2016-08-26 DIAGNOSIS — N183 Chronic kidney disease, stage 3 (moderate): Secondary | ICD-10-CM | POA: Diagnosis not present

## 2016-08-26 DIAGNOSIS — E669 Obesity, unspecified: Secondary | ICD-10-CM | POA: Diagnosis not present

## 2016-08-26 DIAGNOSIS — G4700 Insomnia, unspecified: Secondary | ICD-10-CM | POA: Diagnosis not present

## 2016-08-26 DIAGNOSIS — M797 Fibromyalgia: Secondary | ICD-10-CM | POA: Diagnosis not present

## 2016-08-26 DIAGNOSIS — Z0000 Encounter for general adult medical examination without abnormal findings: Secondary | ICD-10-CM | POA: Diagnosis not present

## 2016-08-26 DIAGNOSIS — Z8546 Personal history of malignant neoplasm of prostate: Secondary | ICD-10-CM | POA: Diagnosis not present

## 2016-08-26 DIAGNOSIS — Z125 Encounter for screening for malignant neoplasm of prostate: Secondary | ICD-10-CM | POA: Diagnosis not present

## 2016-08-26 DIAGNOSIS — I129 Hypertensive chronic kidney disease with stage 1 through stage 4 chronic kidney disease, or unspecified chronic kidney disease: Secondary | ICD-10-CM | POA: Diagnosis not present

## 2016-08-26 DIAGNOSIS — R5382 Chronic fatigue, unspecified: Secondary | ICD-10-CM | POA: Diagnosis not present

## 2016-08-26 DIAGNOSIS — G4733 Obstructive sleep apnea (adult) (pediatric): Secondary | ICD-10-CM | POA: Diagnosis not present

## 2017-02-09 DIAGNOSIS — R079 Chest pain, unspecified: Secondary | ICD-10-CM | POA: Diagnosis not present

## 2017-02-10 ENCOUNTER — Encounter: Payer: Self-pay | Admitting: Cardiovascular Disease

## 2017-02-25 DIAGNOSIS — G4733 Obstructive sleep apnea (adult) (pediatric): Secondary | ICD-10-CM | POA: Diagnosis not present

## 2017-02-25 DIAGNOSIS — N183 Chronic kidney disease, stage 3 (moderate): Secondary | ICD-10-CM | POA: Diagnosis not present

## 2017-02-25 DIAGNOSIS — R5382 Chronic fatigue, unspecified: Secondary | ICD-10-CM | POA: Diagnosis not present

## 2017-02-25 DIAGNOSIS — E782 Mixed hyperlipidemia: Secondary | ICD-10-CM | POA: Diagnosis not present

## 2017-02-25 DIAGNOSIS — M797 Fibromyalgia: Secondary | ICD-10-CM | POA: Diagnosis not present

## 2017-02-25 DIAGNOSIS — G4700 Insomnia, unspecified: Secondary | ICD-10-CM | POA: Diagnosis not present

## 2017-02-25 DIAGNOSIS — E669 Obesity, unspecified: Secondary | ICD-10-CM | POA: Diagnosis not present

## 2017-02-25 DIAGNOSIS — I129 Hypertensive chronic kidney disease with stage 1 through stage 4 chronic kidney disease, or unspecified chronic kidney disease: Secondary | ICD-10-CM | POA: Diagnosis not present

## 2017-02-25 NOTE — Progress Notes (Signed)
Cardiology Office Note   Date:  02/26/2017   ID:  JARROD MCENERY, DOB 1950-09-21, MRN [id number]  PCP:  Irene Limbo  Cardiologist:   Jenkins Rouge, MD   No chief complaint on file.     History of Present Illness: Arthur Hardy is a 66 y.o. male who presents for consultation regarding chest pain. Referred by Kelton Pillar MD   CRF;s include HTN , family history, HL and former smoker   Reviewed primary office note from 02/09/17  Pain started a few months ago Lasts 5 minutes Pressure sensation. A lot of times at rest not exertion. Associated with tingling in left arm at times Seen by Dr Lurline Idol Cardiology years ago with abnormal myovue but Rx medically no cath done  Not very active but plays with dog and walks Chronic fatigue He has OSA but no longer using CPAP as he didn't think it helped Chronic insomnia issues Some mild CRF with baseline CR 1.35   LDL 100  TC 183   Reviewed myovue from 2007 Small inferoseptal defect likely diaphragmatic attenuation EF 75% ECG portion of test was normal Cath done by Dr Radford Pax 2007 with normal left dominant cors Normal EF and normal pressures  He has also had some shooting sharp pains in left shoulder going down arm last 2 weeks Some dizziness when working on ground and then standing   [employer] has played baseball with Olevia Perches Daughter works as Marine scientist at [employer]  Past Medical History:  Diagnosis Date  . Benign hypertension with CKD (chronic kidney disease) stage I   . Chronic fatigue syndrome with fibromyalgia    SEEN BY [facility] (DR O[place])  . CKD (chronic kidney disease) stage 3, GFR 30-59 ml/min   . Hiatal hernia   . Hyperlipidemia   . Insomnia   . Obesity   . Prostate cancer ([place]) 2004   DR. OTTELIN  . Sickle cell trait ([place])   . Sleep apnea    WEARS CPAP  . Tobacco abuse     No past surgical history on file.   Current Outpatient Prescriptions  Medication Sig Dispense Refill  . acetaminophen (TYLENOL) 500  MG tablet Take 500 mg by mouth every 6 (six) hours as needed for mild pain.    Mar[place] Kitchen aspirin 81 MG chewable tablet Chew 81 mg by mouth daily.    . hydrochlorothiazide (HYDRODIURIL) 25 MG tablet Take 25 mg by mouth daily.    Marland Kitchen omeprazole (PRILOSEC OTC) 20 MG tablet Take 20 mg by mouth daily.    . simvastatin (ZOCOR) 40 MG tablet Take 40 mg by mouth daily.    Marland Kitchen triamcinolone cream (KENALOG) 0.5 % Apply 1 application topically 2 (two) times daily.     No current facility-administered medications for this visit.     Allergies:   Patient has no known allergies.    Social History:  The patient  reports that he quit smoking about 12 months ago. He has never used smokeless tobacco. He reports that he does not drink alcohol or use drugs.   Family History:  The patient's Father died age 35 of MI Mother died age 28 of MI   ROS:  Please see the history of present illness.   Otherwise, review of systems are positive for none.   All other systems are reviewed and negative.    PHYSICAL EXAM: VS:  BP 138/78   Pulse 79   Ht 5\' 8"  (1.727 m)   Wt 98.9  kg (218 lb)   BMI 33.15 kg/m  , BMI Body mass index is 33.15 kg/m. Affect appropriate Healthy:  appears stated age 66: normal Neck supple with no adenopathy JVP normal no bruits no thyromegaly Lungs clear with no wheezing and good diaphragmatic motion Heart:  S1/S2 no murmur, no rub, gallop or click PMI normal Abdomen: benighn, BS positve, no tenderness, no AAA no bruit.  No HSM or HJR Distal pulses intact with no bruits No edema Neuro non-focal Skin warm and dry No muscular weakness    EKG:   02/09/17 SR normal ECG no change from 05/17/14 on comparison 02/26/17  NSR rate 79 normal    Recent Labs: No results found for requested labs within last 8760 hours.    Lipid Panel No results found for: CHOL, TRIG, HDL, CHOLHDL, VLDL, LDLCALC, LDLDIRECT    Wt Readings from Last 3 Encounters:  02/26/17 98.9 kg (218 lb)      Other studies  Reviewed: Additional studies/ records that were reviewed today include: Notes primary [facility] ECG labs Myovue 2007 and cath 2007 [facility] Dr Radford Pax .    ASSESSMENT AND PLAN:  1.  Chest Pain ECG ok normal cath in 2007 f/u exercise myovue 2. HTN .nlp 3. HL Cholesterol is at goal.  Continue current dose of statin and diet Rx.  No myalgias or side effects.  F/U  LFT's in 6 months. No results found for: [place] with Dr Irwin Brakeman           4. Smoking quit smoking last year no active wheezing normal lung exam  5. OSA no longer wearing CPAP 6. Dyspnea seems functional see 4 and  5. Contributes Echo to assess LV/RV function And estimate PA pressure    Current medicines are reviewed at length with the patient today.  The patient does not have concerns regarding medicines.  The following changes have been made:  no change  Labs/ tests ordered today include: ETT myovue Echo  No orders of the defined types were placed in this encounter.    Disposition:   FU with me after testing      Signed, Jenkins Rouge, MD  02/26/2017 3:31 PM    [facility] [place], [place], [place]  [address] Phone: [phone]; Fax: [phone]

## 2017-02-26 ENCOUNTER — Ambulatory Visit (INDEPENDENT_AMBULATORY_CARE_PROVIDER_SITE_OTHER): Payer: PPO | Admitting: Cardiovascular Disease

## 2017-02-26 VITALS — BP 138/78 | HR 79 | Ht 68.0 in | Wt 218.0 lb

## 2017-02-26 DIAGNOSIS — R0602 Shortness of breath: Secondary | ICD-10-CM

## 2017-02-26 DIAGNOSIS — R079 Chest pain, unspecified: Secondary | ICD-10-CM

## 2017-02-26 NOTE — Patient Instructions (Signed)
Medication Instructions:  Your physician recommends that you continue on your current medications as directed. Please refer to the Current Medication list given to you today.  Labwork: NONE  Testing/Procedures: Your physician has requested that you have an echocardiogram. Echocardiography is a painless test that uses sound waves to create images of your heart. It provides your doctor with information about the size and shape of your heart and how well your heart's chambers and valves are working. This procedure takes approximately one hour. There are no restrictions for this procedure.  Your physician has requested that you have en exercise stress myoview. For further information please visit [email or web address]. Please follow instruction sheet, as given.  Follow-Up: Your physician wants you to follow-up with Dr. Johnsie Cancel after test are complete.    If you need a refill on your cardiac medications before your next appointment, please call your pharmacy.

## 2017-03-05 ENCOUNTER — Telehealth (HOSPITAL_COMMUNITY): Payer: Self-pay | Admitting: *Deleted

## 2017-03-05 NOTE — Telephone Encounter (Signed)
Patient given detailed instructions per Myocardial Perfusion Study Information Sheet for the test on 03/08/17 Patient notified to arrive 15 minutes early and that it is imperative to arrive on time for appointment to keep from having the test rescheduled.  If you need to cancel or reschedule your appointment, please call the office within 24 hours of your appointment. . Patient verbalized understanding. Arthur Hardy

## 2017-03-10 ENCOUNTER — Ambulatory Visit (HOSPITAL_BASED_OUTPATIENT_CLINIC_OR_DEPARTMENT_OTHER): Payer: PPO

## 2017-03-10 ENCOUNTER — Other Ambulatory Visit: Payer: Self-pay

## 2017-03-10 ENCOUNTER — Ambulatory Visit (HOSPITAL_COMMUNITY): Payer: PPO | Attending: Cardiovascular Disease

## 2017-03-10 DIAGNOSIS — R079 Chest pain, unspecified: Secondary | ICD-10-CM | POA: Insufficient documentation

## 2017-03-10 DIAGNOSIS — R0602 Shortness of breath: Secondary | ICD-10-CM

## 2017-03-10 DIAGNOSIS — I700 Atherosclerosis of aorta: Secondary | ICD-10-CM | POA: Insufficient documentation

## 2017-03-10 DIAGNOSIS — R9439 Abnormal result of other cardiovascular function study: Secondary | ICD-10-CM | POA: Insufficient documentation

## 2017-03-10 LAB — MYOCARDIAL PERFUSION IMAGING
CHL CUP MPHR: 153 {beats}/min
CHL CUP NUCLEAR SSS: 12
CHL RATE OF PERCEIVED EXERTION: 20
CSEPED: 2 min
CSEPHR: 64 %
Estimated workload: 4 METS
Exercise duration (sec): 50 s
LHR: 0.29
LV sys vol: 31 mL
LVDIAVOL: 79 mL (ref 62–150)
Peak HR: 98 {beats}/min
Rest HR: 59 {beats}/min
SDS: 1
SRS: 11
TID: 0.91

## 2017-03-10 LAB — ECHOCARDIOGRAM COMPLETE
HEIGHTINCHES: 68 in
WEIGHTICAEL: 3488 [oz_av]

## 2017-03-10 MED ORDER — TECHNETIUM TC 99M TETROFOSMIN IV KIT
10.5000 | PACK | Freq: Once | INTRAVENOUS | Status: AC | PRN
  Administered 2017-03-10: 10.5 via INTRAVENOUS
  Filled 2017-03-10: qty 11

## 2017-03-10 MED ORDER — REGADENOSON 0.4 MG/5ML IV SOLN
0.4000 mg | Freq: Once | INTRAVENOUS | Status: AC
  Administered 2017-03-10: 0.4 mg via INTRAVENOUS

## 2017-03-10 MED ORDER — TECHNETIUM TC 99M TETROFOSMIN IV KIT
31.3000 | PACK | Freq: Once | INTRAVENOUS | Status: AC | PRN
  Administered 2017-03-10: 31.3 via INTRAVENOUS
  Filled 2017-03-10: qty 32

## 2017-03-16 ENCOUNTER — Telehealth: Payer: Self-pay | Admitting: Cardiovascular Disease

## 2017-03-16 NOTE — Telephone Encounter (Signed)
Called patient with test results. Patient verbalized understanding and had no other questions or concerns at this time.

## 2017-03-16 NOTE — Telephone Encounter (Signed)
New Message   Pt returning RN call about test results. Please call back to discuss

## 2017-04-09 ENCOUNTER — Ambulatory Visit
Admission: RE | Admit: 2017-04-09 | Discharge: 2017-04-09 | Disposition: A | Discharge status: Home / Self Care | Payer: PPO | Source: Ambulatory Visit | Attending: Family Medicine | Admitting: Family Medicine

## 2017-04-09 ENCOUNTER — Other Ambulatory Visit: Payer: Self-pay | Admitting: Family Medicine

## 2017-04-09 DIAGNOSIS — R0602 Shortness of breath: Secondary | ICD-10-CM

## 2017-04-09 DIAGNOSIS — G4733 Obstructive sleep apnea (adult) (pediatric): Secondary | ICD-10-CM | POA: Diagnosis not present

## 2017-04-09 DIAGNOSIS — R5383 Other fatigue: Secondary | ICD-10-CM | POA: Diagnosis not present

## 2017-04-27 NOTE — Progress Notes (Signed)
Cardiology Office Note   Date:  04/30/2017   ID:  Arthur Hardy, DOB Mar 30, 1951, MRN [id number]  PCP:  Irene Limbo  Cardiologist:   Jenkins Rouge, MD   No chief complaint on file.     History of Present Illness: Arthur Hardy is a 66 y.o. male seen initially 02/26/17  for consultation regarding chest pain. Referred by Arthur Pillar MD   CRF;s include HTN , family history, HL and former smoker   Reviewed primary office note from 02/09/17  Pain started a few months ago Lasts 5 minutes Pressure sensation. A lot of times at rest not exertion. Associated with tingling in left arm at times Seen by Dr Lurline Idol Cardiology years ago with abnormal myovue but Rx medically no cath done  Not very active but plays with dog and walks Chronic fatigue He has OSA but no longer using CPAP as he didn't think it helped Chronic insomnia issues Some mild CRF with baseline CR 1.35   LDL 100  TC 183   Reviewed myovue from 2007 Small inferoseptal defect likely diaphragmatic attenuation EF 75% ECG portion of test was normal Cath done by Dr Radford Pax 2007 with normal left dominant cors Normal EF and normal pressures  He has also had some shooting sharp pains in left shoulder going down arm last 2 weeks Some dizziness when working on ground and then standing   [employer] has played baseball with Arthur Hardy Daughter works as Marine scientist at [employer]  F/U echo 03/10/17 normal EF 60-65%  Myovue normal EF 61% 03/10/17  He indicates fatigue since mid 90's Chronic fatigue syndrome   Past Medical History:  Diagnosis Date  . Benign hypertension with CKD (chronic kidney disease) stage I   . Chronic fatigue syndrome with fibromyalgia    SEEN BY [facility] (DR Ouida Sills)  . CKD (chronic kidney disease) stage 3, GFR 30-59 ml/min   . Hiatal hernia   . Hyperlipidemia   . Insomnia   . Obesity   . Prostate cancer ([place]) 2004   DR. OTTELIN  . Sickle cell trait ([place])   . Sleep apnea    WEARS CPAP  . Tobacco abuse       History reviewed. No pertinent surgical history.   Current Outpatient Prescriptions  Medication Sig Dispense Refill  . acetaminophen (TYLENOL) 500 MG tablet Take 500 mg by mouth every 6 (six) hours as needed for mild pain.    Marland Kitchen aspirin 81 MG chewable tablet Chew 81 mg by mouth daily.    . hydrochlorothiazide (HYDRODIURIL) 25 MG tablet Take 25 mg by mouth daily.    Marland Kitchen omeprazole (PRILOSEC OTC) 20 MG tablet Take 20 mg by mouth daily.    . simvastatin (ZOCOR) 40 MG tablet Take 40 mg by mouth daily.     No current facility-administered medications for this visit.     Allergies:   Patient has no known allergies.    Social History:  The patient  reports that he quit smoking about 14 months ago. He has never used smokeless tobacco. He reports that he does not drink alcohol or use drugs.   Family History:  The patient's Father died age 24 of MI Mother died age 19 of MI   ROS:  Please see the history of present illness.   Otherwise, review of systems are positive for none.   All other systems are reviewed and negative.    PHYSICAL EXAM: VS:  BP 130/82   Pulse 62  Ht 5\' 8"  (1.727 m)   Wt 218 lb (98.9 kg)   SpO2 98%   BMI 33.15 kg/m  , BMI Body mass index is 33.15 kg/m. Affect appropriate Healthy:  appears stated age 74: normal Neck supple with no adenopathy JVP normal no bruits no thyromegaly Lungs clear with no wheezing and good diaphragmatic motion Heart:  S1/S2 no murmur, no rub, gallop or click PMI normal Abdomen: benighn, BS positve, no tenderness, no AAA no bruit.  No HSM or HJR Distal pulses intact with no bruits No edema Neuro non-focal Skin warm and dry No muscular weakness    EKG:   02/09/17 SR normal ECG no change from 05/17/14 on comparison 02/26/17  NSR rate 79 normal    Recent Labs: No results found for requested labs within last 8760 hours.    Lipid Panel No results found for: CHOL, TRIG, HDL, CHOLHDL, VLDL, LDLCALC, LDLDIRECT    Wt Readings  from Last 3 Encounters:  04/30/17 218 lb (98.9 kg)  03/10/17 218 lb (98.9 kg)  02/26/17 218 lb (98.9 kg)      Other studies Reviewed: Additional studies/ records that were reviewed today include: Notes primary [facility] ECG labs Myovue 2007 and cath 2007 [facility] Dr Radford Pax .    ASSESSMENT AND PLAN:  1.  Chest Pain ECG ok normal cath in 2007  myovue 03/10/17 normal observe 2. HTN Well controlled.  Continue current medications and low sodium Dash type diet.  3. HL Cholesterol is at goal.  Continue current dose of statin and diet Rx.  No myalgias or side effects.  F/U  LFT's in 6 months. No results found for: [place] with Dr Irwin Brakeman           4. Smoking quit smoking last year no active wheezing normal lung exam  5. OSA no longer wearing CPAP 6. Dyspnea seems functional see 4 and  5. Contributes echo with normal RV/LV 7. Chronic Fatigue Syndrome: patient feels there is something wrong with him that no one  Can diagnose He certainly is not fatigued or dyspnic from heart. F/u primary  Current medicines are reviewed at length with the patient today.  The patient does not have concerns regarding medicines.  The following changes have been made:  no change  Labs/ tests ordered today include:   No orders of the defined types were placed in this encounter.    Disposition:   FU with me in a year      Signed, Jenkins Rouge, MD  04/30/2017 8:48 AM    [facility] [place], [place], [place]  [address] Phone: [phone]; Fax: [phone]

## 2017-04-30 ENCOUNTER — Encounter: Payer: Self-pay | Admitting: Cardiovascular Disease

## 2017-04-30 ENCOUNTER — Ambulatory Visit (INDEPENDENT_AMBULATORY_CARE_PROVIDER_SITE_OTHER): Payer: PPO | Admitting: Cardiovascular Disease

## 2017-04-30 ENCOUNTER — Encounter (INDEPENDENT_AMBULATORY_CARE_PROVIDER_SITE_OTHER): Payer: Self-pay

## 2017-04-30 VITALS — BP 130/82 | HR 62 | Ht 68.0 in | Wt 218.0 lb

## 2017-04-30 DIAGNOSIS — R5382 Chronic fatigue, unspecified: Secondary | ICD-10-CM | POA: Diagnosis not present

## 2017-04-30 NOTE — Patient Instructions (Addendum)

## 2017-05-12 DIAGNOSIS — G4733 Obstructive sleep apnea (adult) (pediatric): Secondary | ICD-10-CM | POA: Diagnosis not present

## 2017-06-11 DIAGNOSIS — G4733 Obstructive sleep apnea (adult) (pediatric): Secondary | ICD-10-CM | POA: Diagnosis not present

## 2017-07-12 DIAGNOSIS — G4733 Obstructive sleep apnea (adult) (pediatric): Secondary | ICD-10-CM | POA: Diagnosis not present

## 2017-08-11 DIAGNOSIS — G4733 Obstructive sleep apnea (adult) (pediatric): Secondary | ICD-10-CM | POA: Diagnosis not present

## 2017-09-11 DIAGNOSIS — G4733 Obstructive sleep apnea (adult) (pediatric): Secondary | ICD-10-CM | POA: Diagnosis not present

## 2017-09-16 DIAGNOSIS — Z1159 Encounter for screening for other viral diseases: Secondary | ICD-10-CM | POA: Diagnosis not present

## 2017-09-16 DIAGNOSIS — G4700 Insomnia, unspecified: Secondary | ICD-10-CM | POA: Diagnosis not present

## 2017-09-16 DIAGNOSIS — M797 Fibromyalgia: Secondary | ICD-10-CM | POA: Diagnosis not present

## 2017-09-16 DIAGNOSIS — Z0000 Encounter for general adult medical examination without abnormal findings: Secondary | ICD-10-CM | POA: Diagnosis not present

## 2017-09-16 DIAGNOSIS — Z8546 Personal history of malignant neoplasm of prostate: Secondary | ICD-10-CM | POA: Diagnosis not present

## 2017-09-16 DIAGNOSIS — Z23 Encounter for immunization: Secondary | ICD-10-CM | POA: Diagnosis not present

## 2017-09-16 DIAGNOSIS — I129 Hypertensive chronic kidney disease with stage 1 through stage 4 chronic kidney disease, or unspecified chronic kidney disease: Secondary | ICD-10-CM | POA: Diagnosis not present

## 2017-09-16 DIAGNOSIS — G4733 Obstructive sleep apnea (adult) (pediatric): Secondary | ICD-10-CM | POA: Diagnosis not present

## 2017-09-16 DIAGNOSIS — E669 Obesity, unspecified: Secondary | ICD-10-CM | POA: Diagnosis not present

## 2017-09-16 DIAGNOSIS — E782 Mixed hyperlipidemia: Secondary | ICD-10-CM | POA: Diagnosis not present

## 2017-09-16 DIAGNOSIS — R5382 Chronic fatigue, unspecified: Secondary | ICD-10-CM | POA: Diagnosis not present

## 2017-09-16 DIAGNOSIS — N183 Chronic kidney disease, stage 3 (moderate): Secondary | ICD-10-CM | POA: Diagnosis not present

## 2017-10-12 DIAGNOSIS — G4733 Obstructive sleep apnea (adult) (pediatric): Secondary | ICD-10-CM | POA: Diagnosis not present

## 2017-10-26 DIAGNOSIS — H35372 Puckering of macula, left eye: Secondary | ICD-10-CM | POA: Diagnosis not present

## 2017-10-26 DIAGNOSIS — H25043 Posterior subcapsular polar age-related cataract, bilateral: Secondary | ICD-10-CM | POA: Diagnosis not present

## 2017-11-09 DIAGNOSIS — G4733 Obstructive sleep apnea (adult) (pediatric): Secondary | ICD-10-CM | POA: Diagnosis not present

## 2017-12-10 DIAGNOSIS — G4733 Obstructive sleep apnea (adult) (pediatric): Secondary | ICD-10-CM | POA: Diagnosis not present

## 2018-01-09 DIAGNOSIS — G4733 Obstructive sleep apnea (adult) (pediatric): Secondary | ICD-10-CM | POA: Diagnosis not present

## 2018-02-09 DIAGNOSIS — G4733 Obstructive sleep apnea (adult) (pediatric): Secondary | ICD-10-CM | POA: Diagnosis not present

## 2018-03-11 DIAGNOSIS — G4733 Obstructive sleep apnea (adult) (pediatric): Secondary | ICD-10-CM | POA: Diagnosis not present

## 2018-03-31 DIAGNOSIS — I129 Hypertensive chronic kidney disease with stage 1 through stage 4 chronic kidney disease, or unspecified chronic kidney disease: Secondary | ICD-10-CM | POA: Diagnosis not present

## 2018-03-31 DIAGNOSIS — E782 Mixed hyperlipidemia: Secondary | ICD-10-CM | POA: Diagnosis not present

## 2018-03-31 DIAGNOSIS — E669 Obesity, unspecified: Secondary | ICD-10-CM | POA: Diagnosis not present

## 2018-03-31 DIAGNOSIS — Z6835 Body mass index (BMI) 35.0-35.9, adult: Secondary | ICD-10-CM | POA: Diagnosis not present

## 2018-03-31 DIAGNOSIS — N183 Chronic kidney disease, stage 3 (moderate): Secondary | ICD-10-CM | POA: Diagnosis not present

## 2018-03-31 DIAGNOSIS — R5382 Chronic fatigue, unspecified: Secondary | ICD-10-CM | POA: Diagnosis not present

## 2018-04-11 DIAGNOSIS — G4733 Obstructive sleep apnea (adult) (pediatric): Secondary | ICD-10-CM | POA: Diagnosis not present

## 2018-05-12 DIAGNOSIS — G4733 Obstructive sleep apnea (adult) (pediatric): Secondary | ICD-10-CM | POA: Diagnosis not present

## 2018-06-11 DIAGNOSIS — G4733 Obstructive sleep apnea (adult) (pediatric): Secondary | ICD-10-CM | POA: Diagnosis not present

## 2018-07-12 DIAGNOSIS — G4733 Obstructive sleep apnea (adult) (pediatric): Secondary | ICD-10-CM | POA: Diagnosis not present

## 2018-08-11 DIAGNOSIS — G4733 Obstructive sleep apnea (adult) (pediatric): Secondary | ICD-10-CM | POA: Diagnosis not present

## 2018-09-11 DIAGNOSIS — G4733 Obstructive sleep apnea (adult) (pediatric): Secondary | ICD-10-CM | POA: Diagnosis not present

## 2018-10-07 DIAGNOSIS — N183 Chronic kidney disease, stage 3 (moderate): Secondary | ICD-10-CM | POA: Diagnosis not present

## 2018-10-07 DIAGNOSIS — M797 Fibromyalgia: Secondary | ICD-10-CM | POA: Diagnosis not present

## 2018-10-07 DIAGNOSIS — E782 Mixed hyperlipidemia: Secondary | ICD-10-CM | POA: Diagnosis not present

## 2018-10-07 DIAGNOSIS — Z8546 Personal history of malignant neoplasm of prostate: Secondary | ICD-10-CM | POA: Diagnosis not present

## 2018-10-07 DIAGNOSIS — Z23 Encounter for immunization: Secondary | ICD-10-CM | POA: Diagnosis not present

## 2018-10-07 DIAGNOSIS — R5382 Chronic fatigue, unspecified: Secondary | ICD-10-CM | POA: Diagnosis not present

## 2018-10-07 DIAGNOSIS — G4700 Insomnia, unspecified: Secondary | ICD-10-CM | POA: Diagnosis not present

## 2018-10-07 DIAGNOSIS — I129 Hypertensive chronic kidney disease with stage 1 through stage 4 chronic kidney disease, or unspecified chronic kidney disease: Secondary | ICD-10-CM | POA: Diagnosis not present

## 2018-10-07 DIAGNOSIS — Z6834 Body mass index (BMI) 34.0-34.9, adult: Secondary | ICD-10-CM | POA: Diagnosis not present

## 2018-10-07 DIAGNOSIS — E669 Obesity, unspecified: Secondary | ICD-10-CM | POA: Diagnosis not present

## 2018-10-07 DIAGNOSIS — G4733 Obstructive sleep apnea (adult) (pediatric): Secondary | ICD-10-CM | POA: Diagnosis not present

## 2018-10-07 DIAGNOSIS — Z0000 Encounter for general adult medical examination without abnormal findings: Secondary | ICD-10-CM | POA: Diagnosis not present

## 2018-10-12 DIAGNOSIS — G4733 Obstructive sleep apnea (adult) (pediatric): Secondary | ICD-10-CM | POA: Diagnosis not present

## 2018-10-22 DIAGNOSIS — H04129 Dry eye syndrome of unspecified lacrimal gland: Secondary | ICD-10-CM | POA: Diagnosis not present

## 2018-10-22 DIAGNOSIS — H25043 Posterior subcapsular polar age-related cataract, bilateral: Secondary | ICD-10-CM | POA: Diagnosis not present

## 2018-10-22 DIAGNOSIS — H35372 Puckering of macula, left eye: Secondary | ICD-10-CM | POA: Diagnosis not present

## 2018-10-22 DIAGNOSIS — H25011 Cortical age-related cataract, right eye: Secondary | ICD-10-CM | POA: Diagnosis not present

## 2018-11-10 DIAGNOSIS — G4733 Obstructive sleep apnea (adult) (pediatric): Secondary | ICD-10-CM | POA: Diagnosis not present

## 2018-12-11 DIAGNOSIS — G4733 Obstructive sleep apnea (adult) (pediatric): Secondary | ICD-10-CM | POA: Diagnosis not present

## 2019-01-10 DIAGNOSIS — G4733 Obstructive sleep apnea (adult) (pediatric): Secondary | ICD-10-CM | POA: Diagnosis not present

## 2019-02-10 DIAGNOSIS — G4733 Obstructive sleep apnea (adult) (pediatric): Secondary | ICD-10-CM | POA: Diagnosis not present

## 2019-02-22 DIAGNOSIS — H16223 Keratoconjunctivitis sicca, not specified as Sjogren's, bilateral: Secondary | ICD-10-CM | POA: Diagnosis not present

## 2019-02-22 DIAGNOSIS — H0288B Meibomian gland dysfunction left eye, upper and lower eyelids: Secondary | ICD-10-CM | POA: Diagnosis not present

## 2019-02-22 DIAGNOSIS — H0288A Meibomian gland dysfunction right eye, upper and lower eyelids: Secondary | ICD-10-CM | POA: Diagnosis not present

## 2019-03-12 DIAGNOSIS — G4733 Obstructive sleep apnea (adult) (pediatric): Secondary | ICD-10-CM | POA: Diagnosis not present

## 2019-04-06 DIAGNOSIS — E782 Mixed hyperlipidemia: Secondary | ICD-10-CM | POA: Diagnosis not present

## 2019-04-06 DIAGNOSIS — I129 Hypertensive chronic kidney disease with stage 1 through stage 4 chronic kidney disease, or unspecified chronic kidney disease: Secondary | ICD-10-CM | POA: Diagnosis not present

## 2019-04-08 DIAGNOSIS — E782 Mixed hyperlipidemia: Secondary | ICD-10-CM | POA: Diagnosis not present

## 2019-04-08 DIAGNOSIS — Z7189 Other specified counseling: Secondary | ICD-10-CM | POA: Diagnosis not present

## 2019-04-08 DIAGNOSIS — G4733 Obstructive sleep apnea (adult) (pediatric): Secondary | ICD-10-CM | POA: Diagnosis not present

## 2019-04-08 DIAGNOSIS — R5382 Chronic fatigue, unspecified: Secondary | ICD-10-CM | POA: Diagnosis not present

## 2019-04-08 DIAGNOSIS — E669 Obesity, unspecified: Secondary | ICD-10-CM | POA: Diagnosis not present

## 2019-04-08 DIAGNOSIS — I129 Hypertensive chronic kidney disease with stage 1 through stage 4 chronic kidney disease, or unspecified chronic kidney disease: Secondary | ICD-10-CM | POA: Diagnosis not present

## 2019-04-08 DIAGNOSIS — Z6832 Body mass index (BMI) 32.0-32.9, adult: Secondary | ICD-10-CM | POA: Diagnosis not present

## 2019-04-08 DIAGNOSIS — N183 Chronic kidney disease, stage 3 (moderate): Secondary | ICD-10-CM | POA: Diagnosis not present

## 2019-04-12 DIAGNOSIS — G4733 Obstructive sleep apnea (adult) (pediatric): Secondary | ICD-10-CM | POA: Diagnosis not present

## 2019-05-13 DIAGNOSIS — G4733 Obstructive sleep apnea (adult) (pediatric): Secondary | ICD-10-CM | POA: Diagnosis not present

## 2019-06-04 IMAGING — NM NM MISC PROCEDURE
6 series · 36 of 36 positions shown · non-contrast
Comparison: none

[Series 1: rest · 6.51mm/px · 6 of 64 frames shown]
[frame 6/64]
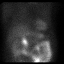
[frame 16/64]
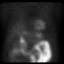
[frame 27/64]
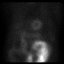
[frame 38/64]
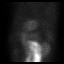
[frame 48/64]
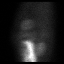
[frame 59/64]
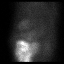

[Series 1: wbr_r-proj_st rest · 6.51mm/px · 6 of 64 frames shown]
[frame 6/64]
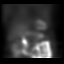
[frame 16/64]
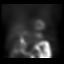
[frame 27/64]
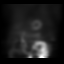
[frame 38/64]
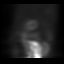
[frame 48/64]
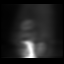
[frame 59/64]
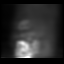

[Series 2: stress · 6.51mm/px · 6 of 64 frames shown (1 of 2)]
[frame 6/64]
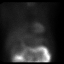
[frame 16/64]
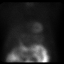
[frame 27/64]
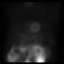
[frame 38/64]
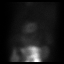
[frame 48/64]
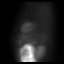
[frame 59/64]
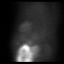

[Series 2: stress · 6.51mm/px · 6 of 512 frames shown (2 of 2)]
[frame 43/512]
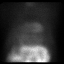
[frame 128/512]
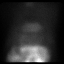
[frame 214/512]
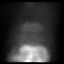
[frame 299/512]
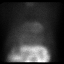
[frame 384/512]
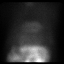
[frame 470/512]
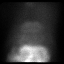

[Series 2: wbr_s-proj_st stress · 6.51mm/px · 6 of 64 frames shown (1 of 2)]
[frame 6/64]
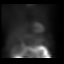
[frame 16/64]
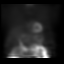
[frame 27/64]
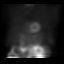
[frame 38/64]
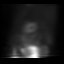
[frame 48/64]
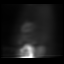
[frame 59/64]
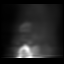

[Series 2: wbr_s-proj_st stress · 6.51mm/px · 6 of 512 frames shown (2 of 2)]
[frame 43/512]
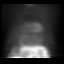
[frame 128/512]
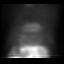
[frame 214/512]
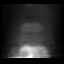
[frame 299/512]
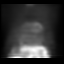
[frame 384/512]
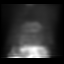
[frame 470/512]
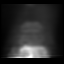

[36 of 36 positions shown; findings below may reference images not displayed]

Canned report from images found in remote index.

Refer to host system for actual result text.

## 2019-06-12 DIAGNOSIS — G4733 Obstructive sleep apnea (adult) (pediatric): Secondary | ICD-10-CM | POA: Diagnosis not present

## 2019-07-13 DIAGNOSIS — G4733 Obstructive sleep apnea (adult) (pediatric): Secondary | ICD-10-CM | POA: Diagnosis not present

## 2019-08-12 DIAGNOSIS — G4733 Obstructive sleep apnea (adult) (pediatric): Secondary | ICD-10-CM | POA: Diagnosis not present

## 2019-09-12 DIAGNOSIS — G4733 Obstructive sleep apnea (adult) (pediatric): Secondary | ICD-10-CM | POA: Diagnosis not present

## 2019-10-13 DIAGNOSIS — G4733 Obstructive sleep apnea (adult) (pediatric): Secondary | ICD-10-CM | POA: Diagnosis not present

## 2019-10-24 DIAGNOSIS — R5382 Chronic fatigue, unspecified: Secondary | ICD-10-CM | POA: Diagnosis not present

## 2019-10-24 DIAGNOSIS — E669 Obesity, unspecified: Secondary | ICD-10-CM | POA: Diagnosis not present

## 2019-10-24 DIAGNOSIS — M797 Fibromyalgia: Secondary | ICD-10-CM | POA: Diagnosis not present

## 2019-10-24 DIAGNOSIS — I129 Hypertensive chronic kidney disease with stage 1 through stage 4 chronic kidney disease, or unspecified chronic kidney disease: Secondary | ICD-10-CM | POA: Diagnosis not present

## 2019-10-24 DIAGNOSIS — N1831 Chronic kidney disease, stage 3a: Secondary | ICD-10-CM | POA: Diagnosis not present

## 2019-10-24 DIAGNOSIS — Z8546 Personal history of malignant neoplasm of prostate: Secondary | ICD-10-CM | POA: Diagnosis not present

## 2019-10-24 DIAGNOSIS — E782 Mixed hyperlipidemia: Secondary | ICD-10-CM | POA: Diagnosis not present

## 2019-10-24 DIAGNOSIS — Z0000 Encounter for general adult medical examination without abnormal findings: Secondary | ICD-10-CM | POA: Diagnosis not present

## 2019-10-24 DIAGNOSIS — G4733 Obstructive sleep apnea (adult) (pediatric): Secondary | ICD-10-CM | POA: Diagnosis not present

## 2019-10-24 DIAGNOSIS — G4700 Insomnia, unspecified: Secondary | ICD-10-CM | POA: Diagnosis not present

## 2019-11-09 DIAGNOSIS — N179 Acute kidney failure, unspecified: Secondary | ICD-10-CM | POA: Diagnosis not present

## 2019-11-10 DIAGNOSIS — G4733 Obstructive sleep apnea (adult) (pediatric): Secondary | ICD-10-CM | POA: Diagnosis not present

## 2019-12-11 DIAGNOSIS — G4733 Obstructive sleep apnea (adult) (pediatric): Secondary | ICD-10-CM | POA: Diagnosis not present

## 2019-12-12 DIAGNOSIS — N179 Acute kidney failure, unspecified: Secondary | ICD-10-CM | POA: Diagnosis not present

## 2020-01-10 DIAGNOSIS — G4733 Obstructive sleep apnea (adult) (pediatric): Secondary | ICD-10-CM | POA: Diagnosis not present

## 2020-02-10 DIAGNOSIS — G4733 Obstructive sleep apnea (adult) (pediatric): Secondary | ICD-10-CM | POA: Diagnosis not present

## 2020-03-11 DIAGNOSIS — G4733 Obstructive sleep apnea (adult) (pediatric): Secondary | ICD-10-CM | POA: Diagnosis not present

## 2020-04-11 DIAGNOSIS — G4733 Obstructive sleep apnea (adult) (pediatric): Secondary | ICD-10-CM | POA: Diagnosis not present

## 2020-05-11 DIAGNOSIS — T83410A Breakdown (mechanical) of penile (implanted) prosthesis, initial encounter: Secondary | ICD-10-CM | POA: Diagnosis not present

## 2020-05-11 DIAGNOSIS — C61 Malignant neoplasm of prostate: Secondary | ICD-10-CM | POA: Diagnosis not present

## 2020-05-11 DIAGNOSIS — N393 Stress incontinence (female) (male): Secondary | ICD-10-CM | POA: Diagnosis not present

## 2020-05-12 DIAGNOSIS — G4733 Obstructive sleep apnea (adult) (pediatric): Secondary | ICD-10-CM | POA: Diagnosis not present

## 2020-06-10 ENCOUNTER — Other Ambulatory Visit: Payer: Self-pay | Admitting: Adult Health

## 2020-06-10 DIAGNOSIS — U071 COVID-19: Secondary | ICD-10-CM

## 2020-06-10 NOTE — Progress Notes (Signed)
I connected by phone with Madelyn Brunner on 06/10/2020 at 3:57 PM to discuss the potential use of a new treatment for mild to moderate COVID-19 viral infection in non-hospitalized patients.  This patient is a 69 y.o. male that meets the FDA criteria for Emergency Use Authorization of COVID monoclonal antibody casirivimab/imdevimab or bamlanivimab/eteseviamb.  Has a (+) direct SARS-CoV-2 viral test result  Has mild or moderate COVID-19   Is NOT hospitalized due to COVID-19  Is within 10 days of symptom onset  Has at least one of the high risk factor(s) for progression to severe COVID-19 and/or hospitalization as defined in EUA.  Specific high risk criteria : Older age (>/= 71 yo)   I have spoken and communicated the following to the patient or parent/caregiver regarding COVID monoclonal antibody treatment:  1. FDA has authorized the emergency use for the treatment of mild to moderate COVID-19 in adults and pediatric patients with positive results of direct SARS-CoV-2 viral testing who are 46 years of age and older weighing at least 40 kg, and who are at high risk for progressing to severe COVID-19 and/or hospitalization.  2. The significant known and potential risks and benefits of COVID monoclonal antibody, and the extent to which such potential risks and benefits are unknown.  3. Information on available alternative treatments and the risks and benefits of those alternatives, including clinical trials.  4. Patients treated with COVID monoclonal antibody should continue to self-isolate and use infection control measures (e.g., wear mask, isolate, social distance, avoid sharing personal items, clean and disinfect "high touch" surfaces, and frequent handwashing) according to CDC guidelines.   5. The patient or parent/caregiver has the option to accept or refuse COVID monoclonal antibody treatment.  After reviewing this information with the patient, the patient has agreed to receive one  of the available covid 19 monoclonal antibodies and will be provided an appropriate fact sheet prior to infusion.  Set up for 10/4 at [place], NP 06/10/2020 3:57 PM

## 2020-06-11 ENCOUNTER — Ambulatory Visit (HOSPITAL_COMMUNITY)
Admission: RE | Admit: 2020-06-11 | Discharge: 2020-06-11 | Disposition: A | Discharge status: Home / Self Care | Payer: Medicare Other | Source: Ambulatory Visit | Attending: Pulmonary Disease | Admitting: Pulmonary Disease

## 2020-06-11 DIAGNOSIS — Z23 Encounter for immunization: Secondary | ICD-10-CM | POA: Insufficient documentation

## 2020-06-11 DIAGNOSIS — U071 COVID-19: Secondary | ICD-10-CM | POA: Diagnosis present

## 2020-06-11 DIAGNOSIS — G4733 Obstructive sleep apnea (adult) (pediatric): Secondary | ICD-10-CM | POA: Diagnosis not present

## 2020-06-11 MED ORDER — DIPHENHYDRAMINE HCL 50 MG/ML IJ SOLN: 50.0000 mg | Freq: Once | INTRAMUSCULAR | Status: DC | PRN

## 2020-06-11 MED ORDER — FAMOTIDINE IN NACL 20-0.9 MG/50ML-% IV SOLN: 20.0000 mg | Freq: Once | INTRAVENOUS | Status: DC | PRN

## 2020-06-11 MED ORDER — METHYLPREDNISOLONE SODIUM SUCC 125 MG IJ SOLR: 125.0000 mg | Freq: Once | INTRAMUSCULAR | Status: DC | PRN

## 2020-06-11 MED ORDER — ALBUTEROL SULFATE HFA 108 (90 BASE) MCG/ACT IN AERS: 2.0000 | INHALATION_SPRAY | Freq: Once | RESPIRATORY_TRACT | Status: DC | PRN

## 2020-06-11 MED ORDER — SODIUM CHLORIDE 0.9 % IV SOLN
1200.0000 mg | Freq: Once | INTRAVENOUS | Status: AC
  Administered 2020-06-11: 1200 mg via INTRAVENOUS

## 2020-06-11 MED ORDER — EPINEPHRINE 0.3 MG/0.3ML IJ SOAJ: 0.3000 mg | Freq: Once | INTRAMUSCULAR | Status: DC | PRN

## 2020-06-11 MED ORDER — SODIUM CHLORIDE 0.9 % IV SOLN: INTRAVENOUS | Status: DC | PRN

## 2020-06-11 NOTE — Progress Notes (Signed)
  Diagnosis: COVID-19  Physician: Dr. Asencion Noble  Procedure: [facility] Infusion Clinic Med: casirivimab\imdevimab infusion - Provided patient with casirivimab\imdevimab fact sheet for patients, parents and caregivers prior to infusion.  Complications: No immediate complications noted.  Discharge: Discharged home   Arthur Hardy, Cathlyn Parsons 06/11/2020

## 2020-06-11 NOTE — Discharge Instructions (Signed)

## 2020-07-12 DIAGNOSIS — G4733 Obstructive sleep apnea (adult) (pediatric): Secondary | ICD-10-CM | POA: Diagnosis not present

## 2020-08-11 DIAGNOSIS — G4733 Obstructive sleep apnea (adult) (pediatric): Secondary | ICD-10-CM | POA: Diagnosis not present

## 2020-09-11 DIAGNOSIS — G4733 Obstructive sleep apnea (adult) (pediatric): Secondary | ICD-10-CM | POA: Diagnosis not present

## 2020-10-12 DIAGNOSIS — G4733 Obstructive sleep apnea (adult) (pediatric): Secondary | ICD-10-CM | POA: Diagnosis not present

## 2020-11-01 DIAGNOSIS — E669 Obesity, unspecified: Secondary | ICD-10-CM | POA: Diagnosis not present

## 2020-11-01 DIAGNOSIS — N183 Chronic kidney disease, stage 3 unspecified: Secondary | ICD-10-CM | POA: Diagnosis not present

## 2020-11-01 DIAGNOSIS — Z0000 Encounter for general adult medical examination without abnormal findings: Secondary | ICD-10-CM | POA: Diagnosis not present

## 2020-11-01 DIAGNOSIS — R5382 Chronic fatigue, unspecified: Secondary | ICD-10-CM | POA: Diagnosis not present

## 2020-11-01 DIAGNOSIS — G4733 Obstructive sleep apnea (adult) (pediatric): Secondary | ICD-10-CM | POA: Diagnosis not present

## 2020-11-01 DIAGNOSIS — Z91030 Bee allergy status: Secondary | ICD-10-CM | POA: Diagnosis not present

## 2020-11-01 DIAGNOSIS — E782 Mixed hyperlipidemia: Secondary | ICD-10-CM | POA: Diagnosis not present

## 2020-11-01 DIAGNOSIS — M797 Fibromyalgia: Secondary | ICD-10-CM | POA: Diagnosis not present

## 2020-11-01 DIAGNOSIS — Z8546 Personal history of malignant neoplasm of prostate: Secondary | ICD-10-CM | POA: Diagnosis not present

## 2020-11-01 DIAGNOSIS — G4700 Insomnia, unspecified: Secondary | ICD-10-CM | POA: Diagnosis not present

## 2020-11-01 DIAGNOSIS — I129 Hypertensive chronic kidney disease with stage 1 through stage 4 chronic kidney disease, or unspecified chronic kidney disease: Secondary | ICD-10-CM | POA: Diagnosis not present

## 2020-11-09 DIAGNOSIS — G4733 Obstructive sleep apnea (adult) (pediatric): Secondary | ICD-10-CM | POA: Diagnosis not present

## 2020-12-10 DIAGNOSIS — G4733 Obstructive sleep apnea (adult) (pediatric): Secondary | ICD-10-CM | POA: Diagnosis not present

## 2021-03-11 DIAGNOSIS — G4733 Obstructive sleep apnea (adult) (pediatric): Secondary | ICD-10-CM | POA: Diagnosis not present

## 2021-04-11 DIAGNOSIS — G4733 Obstructive sleep apnea (adult) (pediatric): Secondary | ICD-10-CM | POA: Diagnosis not present

## 2021-04-19 DIAGNOSIS — H25043 Posterior subcapsular polar age-related cataract, bilateral: Secondary | ICD-10-CM | POA: Diagnosis not present

## 2021-04-19 DIAGNOSIS — H25013 Cortical age-related cataract, bilateral: Secondary | ICD-10-CM | POA: Diagnosis not present

## 2021-04-19 DIAGNOSIS — H35372 Puckering of macula, left eye: Secondary | ICD-10-CM | POA: Diagnosis not present

## 2021-05-12 DIAGNOSIS — G4733 Obstructive sleep apnea (adult) (pediatric): Secondary | ICD-10-CM | POA: Diagnosis not present

## 2021-06-11 DIAGNOSIS — H25043 Posterior subcapsular polar age-related cataract, bilateral: Secondary | ICD-10-CM | POA: Diagnosis not present

## 2021-06-11 DIAGNOSIS — H2513 Age-related nuclear cataract, bilateral: Secondary | ICD-10-CM | POA: Diagnosis not present

## 2021-06-11 DIAGNOSIS — H2512 Age-related nuclear cataract, left eye: Secondary | ICD-10-CM | POA: Diagnosis not present

## 2021-06-11 DIAGNOSIS — H18413 Arcus senilis, bilateral: Secondary | ICD-10-CM | POA: Diagnosis not present

## 2021-06-11 DIAGNOSIS — H35372 Puckering of macula, left eye: Secondary | ICD-10-CM | POA: Diagnosis not present

## 2021-06-11 DIAGNOSIS — G4733 Obstructive sleep apnea (adult) (pediatric): Secondary | ICD-10-CM | POA: Diagnosis not present

## 2021-06-11 DIAGNOSIS — H25013 Cortical age-related cataract, bilateral: Secondary | ICD-10-CM | POA: Diagnosis not present

## 2021-07-12 DIAGNOSIS — G4733 Obstructive sleep apnea (adult) (pediatric): Secondary | ICD-10-CM | POA: Diagnosis not present

## 2021-08-11 DIAGNOSIS — G4733 Obstructive sleep apnea (adult) (pediatric): Secondary | ICD-10-CM | POA: Diagnosis not present

## 2021-08-19 DIAGNOSIS — H2512 Age-related nuclear cataract, left eye: Secondary | ICD-10-CM | POA: Diagnosis not present

## 2021-08-19 DIAGNOSIS — H25013 Cortical age-related cataract, bilateral: Secondary | ICD-10-CM | POA: Diagnosis not present

## 2021-08-20 DIAGNOSIS — H2511 Age-related nuclear cataract, right eye: Secondary | ICD-10-CM | POA: Diagnosis not present

## 2021-09-11 DIAGNOSIS — G4733 Obstructive sleep apnea (adult) (pediatric): Secondary | ICD-10-CM | POA: Diagnosis not present

## 2021-09-16 DIAGNOSIS — H25011 Cortical age-related cataract, right eye: Secondary | ICD-10-CM | POA: Diagnosis not present

## 2021-09-16 DIAGNOSIS — H2511 Age-related nuclear cataract, right eye: Secondary | ICD-10-CM | POA: Diagnosis not present

## 2021-10-12 DIAGNOSIS — G4733 Obstructive sleep apnea (adult) (pediatric): Secondary | ICD-10-CM | POA: Diagnosis not present

## 2021-11-09 DIAGNOSIS — G4733 Obstructive sleep apnea (adult) (pediatric): Secondary | ICD-10-CM | POA: Diagnosis not present

## 2021-11-11 DIAGNOSIS — N183 Chronic kidney disease, stage 3 unspecified: Secondary | ICD-10-CM | POA: Diagnosis not present

## 2021-11-11 DIAGNOSIS — I129 Hypertensive chronic kidney disease with stage 1 through stage 4 chronic kidney disease, or unspecified chronic kidney disease: Secondary | ICD-10-CM | POA: Diagnosis not present

## 2021-11-11 DIAGNOSIS — Z23 Encounter for immunization: Secondary | ICD-10-CM | POA: Diagnosis not present

## 2021-11-11 DIAGNOSIS — R5382 Chronic fatigue, unspecified: Secondary | ICD-10-CM | POA: Diagnosis not present

## 2021-11-11 DIAGNOSIS — G4733 Obstructive sleep apnea (adult) (pediatric): Secondary | ICD-10-CM | POA: Diagnosis not present

## 2021-11-11 DIAGNOSIS — E782 Mixed hyperlipidemia: Secondary | ICD-10-CM | POA: Diagnosis not present

## 2021-11-11 DIAGNOSIS — Z0000 Encounter for general adult medical examination without abnormal findings: Secondary | ICD-10-CM | POA: Diagnosis not present

## 2021-11-11 DIAGNOSIS — M797 Fibromyalgia: Secondary | ICD-10-CM | POA: Diagnosis not present

## 2021-11-11 DIAGNOSIS — E669 Obesity, unspecified: Secondary | ICD-10-CM | POA: Diagnosis not present

## 2021-11-11 DIAGNOSIS — Z91030 Bee allergy status: Secondary | ICD-10-CM | POA: Diagnosis not present

## 2021-11-11 DIAGNOSIS — G4700 Insomnia, unspecified: Secondary | ICD-10-CM | POA: Diagnosis not present

## 2021-11-11 DIAGNOSIS — M1990 Unspecified osteoarthritis, unspecified site: Secondary | ICD-10-CM | POA: Diagnosis not present

## 2021-12-10 DIAGNOSIS — G4733 Obstructive sleep apnea (adult) (pediatric): Secondary | ICD-10-CM | POA: Diagnosis not present

## 2022-01-09 DIAGNOSIS — G4733 Obstructive sleep apnea (adult) (pediatric): Secondary | ICD-10-CM | POA: Diagnosis not present

## 2022-01-30 DIAGNOSIS — H35372 Puckering of macula, left eye: Secondary | ICD-10-CM | POA: Diagnosis not present

## 2022-01-30 DIAGNOSIS — Z961 Presence of intraocular lens: Secondary | ICD-10-CM | POA: Diagnosis not present

## 2022-01-30 DIAGNOSIS — H53143 Visual discomfort, bilateral: Secondary | ICD-10-CM | POA: Diagnosis not present

## 2022-02-09 DIAGNOSIS — G4733 Obstructive sleep apnea (adult) (pediatric): Secondary | ICD-10-CM | POA: Diagnosis not present

## 2022-11-19 ENCOUNTER — Other Ambulatory Visit: Payer: Self-pay | Admitting: Family Medicine

## 2022-11-19 ENCOUNTER — Ambulatory Visit
Admission: RE | Admit: 2022-11-19 | Discharge: 2022-11-19 | Disposition: A | Discharge status: Home / Self Care | Payer: PPO | Source: Ambulatory Visit | Attending: Family Medicine | Admitting: Family Medicine

## 2022-11-19 DIAGNOSIS — N183 Chronic kidney disease, stage 3 unspecified: Secondary | ICD-10-CM | POA: Diagnosis not present

## 2022-11-19 DIAGNOSIS — M797 Fibromyalgia: Secondary | ICD-10-CM | POA: Diagnosis not present

## 2022-11-19 DIAGNOSIS — E669 Obesity, unspecified: Secondary | ICD-10-CM | POA: Diagnosis not present

## 2022-11-19 DIAGNOSIS — E782 Mixed hyperlipidemia: Secondary | ICD-10-CM | POA: Diagnosis not present

## 2022-11-19 DIAGNOSIS — R102 Pelvic and perineal pain: Secondary | ICD-10-CM | POA: Diagnosis not present

## 2022-11-19 DIAGNOSIS — I129 Hypertensive chronic kidney disease with stage 1 through stage 4 chronic kidney disease, or unspecified chronic kidney disease: Secondary | ICD-10-CM | POA: Diagnosis not present

## 2022-11-19 DIAGNOSIS — R5382 Chronic fatigue, unspecified: Secondary | ICD-10-CM | POA: Diagnosis not present

## 2022-11-19 DIAGNOSIS — M545 Low back pain, unspecified: Secondary | ICD-10-CM | POA: Diagnosis not present

## 2022-11-19 DIAGNOSIS — M1990 Unspecified osteoarthritis, unspecified site: Secondary | ICD-10-CM | POA: Diagnosis not present

## 2022-11-19 DIAGNOSIS — G4733 Obstructive sleep apnea (adult) (pediatric): Secondary | ICD-10-CM | POA: Diagnosis not present

## 2022-11-19 DIAGNOSIS — Z0000 Encounter for general adult medical examination without abnormal findings: Secondary | ICD-10-CM | POA: Diagnosis not present

## 2022-11-19 DIAGNOSIS — Z8546 Personal history of malignant neoplasm of prostate: Secondary | ICD-10-CM | POA: Diagnosis not present

## 2022-11-19 DIAGNOSIS — Z91030 Bee allergy status: Secondary | ICD-10-CM | POA: Diagnosis not present

## 2022-12-22 DIAGNOSIS — H35372 Puckering of macula, left eye: Secondary | ICD-10-CM | POA: Diagnosis not present

## 2022-12-22 DIAGNOSIS — H26491 Other secondary cataract, right eye: Secondary | ICD-10-CM | POA: Diagnosis not present

## 2022-12-22 DIAGNOSIS — E876 Hypokalemia: Secondary | ICD-10-CM | POA: Diagnosis not present

## 2022-12-22 DIAGNOSIS — Z961 Presence of intraocular lens: Secondary | ICD-10-CM | POA: Diagnosis not present

## 2023-01-27 DIAGNOSIS — Z961 Presence of intraocular lens: Secondary | ICD-10-CM | POA: Diagnosis not present

## 2023-01-27 DIAGNOSIS — H18413 Arcus senilis, bilateral: Secondary | ICD-10-CM | POA: Diagnosis not present

## 2023-01-27 DIAGNOSIS — H35373 Puckering of macula, bilateral: Secondary | ICD-10-CM | POA: Diagnosis not present

## 2023-01-27 DIAGNOSIS — H26493 Other secondary cataract, bilateral: Secondary | ICD-10-CM | POA: Diagnosis not present

## 2023-01-27 DIAGNOSIS — H26491 Other secondary cataract, right eye: Secondary | ICD-10-CM | POA: Diagnosis not present

## 2023-04-27 DIAGNOSIS — I959 Hypotension, unspecified: Secondary | ICD-10-CM | POA: Diagnosis not present

## 2023-04-27 DIAGNOSIS — R001 Bradycardia, unspecified: Secondary | ICD-10-CM | POA: Diagnosis not present

## 2023-04-27 DIAGNOSIS — R5383 Other fatigue: Secondary | ICD-10-CM | POA: Diagnosis not present

## 2023-04-27 DIAGNOSIS — E782 Mixed hyperlipidemia: Secondary | ICD-10-CM | POA: Diagnosis not present

## 2023-05-12 ENCOUNTER — Ambulatory Visit
Admission: RE | Admit: 2023-05-12 | Discharge: 2023-05-12 | Disposition: A | Discharge status: Home / Self Care | Payer: PPO | Source: Ambulatory Visit | Attending: Family Medicine | Admitting: Family Medicine

## 2023-05-12 ENCOUNTER — Other Ambulatory Visit: Payer: Self-pay | Admitting: Family Medicine

## 2023-05-12 DIAGNOSIS — R0789 Other chest pain: Secondary | ICD-10-CM

## 2023-05-12 DIAGNOSIS — I129 Hypertensive chronic kidney disease with stage 1 through stage 4 chronic kidney disease, or unspecified chronic kidney disease: Secondary | ICD-10-CM | POA: Diagnosis not present

## 2023-05-12 DIAGNOSIS — R109 Unspecified abdominal pain: Secondary | ICD-10-CM | POA: Diagnosis not present

## 2023-05-12 DIAGNOSIS — R001 Bradycardia, unspecified: Secondary | ICD-10-CM | POA: Diagnosis not present

## 2023-05-13 ENCOUNTER — Other Ambulatory Visit: Payer: Self-pay | Admitting: Family Medicine

## 2023-05-13 DIAGNOSIS — R109 Unspecified abdominal pain: Secondary | ICD-10-CM

## 2023-05-29 ENCOUNTER — Encounter: Payer: Self-pay | Admitting: Cardiovascular Disease

## 2023-05-29 ENCOUNTER — Ambulatory Visit: Payer: PPO | Attending: Cardiovascular Disease | Admitting: Cardiovascular Disease

## 2023-05-29 VITALS — BP 136/78 | HR 57 | Ht 70.0 in | Wt 212.0 lb

## 2023-05-29 DIAGNOSIS — R079 Chest pain, unspecified: Secondary | ICD-10-CM

## 2023-05-29 DIAGNOSIS — Z136 Encounter for screening for cardiovascular disorders: Secondary | ICD-10-CM

## 2023-05-29 DIAGNOSIS — I4510 Unspecified right bundle-branch block: Secondary | ICD-10-CM | POA: Diagnosis not present

## 2023-05-29 DIAGNOSIS — I495 Sick sinus syndrome: Secondary | ICD-10-CM | POA: Diagnosis not present

## 2023-05-29 DIAGNOSIS — R109 Unspecified abdominal pain: Secondary | ICD-10-CM

## 2023-05-29 DIAGNOSIS — I5040 Unspecified combined systolic (congestive) and diastolic (congestive) heart failure: Secondary | ICD-10-CM

## 2023-05-29 MED ORDER — METOPROLOL TARTRATE 25 MG PO TABS: 25.0000 mg | ORAL_TABLET | Freq: Once | ORAL | 0 refills | Status: DC

## 2023-05-29 NOTE — Progress Notes (Unsigned)
Cardiology Office Note    Date:  06/01/2023   ID:  Arthur Hardy, DOB [birth year], MRN [id number]  PCP:  Lupita Raider, MD  Cardiologist:  Nicki Guadalajara, MD   New cardiology evaluation referred through the courtesy of Dr. Cam Hai for evaluation of symptomatic bradycardia and episodic vague chest heaviness.   History of Present Illness:  Arthur Hardy is a 72 y.o. male who is followed by Dr. Cam Hai at [place] for primary care.  The 2018, he had seen Dr. Traci Sermon for cardiology evaluation.  He has a history of hypertension, family history for CAD, hyperlipidemia, and prior tobacco use.  Remotely in 2017 he had a nuclear perfusion study which showed small inferoseptal defect most likely diaphragmatic attenuation.  He had undergone cardiac catheterization by Dr. Mayford Knife in this demonstrated normal coronary arteries with normal EF and pressures.  Remotely he had transiently used CPAP therapy for obstructive sleep apnea but stopped therapy years ago.  Recently, he was evaluated by Dr. Cam Hai.  He has had issues with orthostatic hypotension and bradycardia which seem to occur fairly suddenly on March 26, 2023.  He had had weight loss from dietary adjustment and at that time had seen Dr. Judithann Sheen who recommended he stop taking hydrochlorothiazide.  He has felt better with the discontinuance of hydrochlorothiazide.  He had seen Dr. Clelia Croft in follow-up and oftentimes heart rates have been in the 40s to 50s at home with blood pressures in the 100-140/30-50s.  He has been wearing oxygen at night.  He has a remote history of prostate cancer treated by Dr. Vernie Ammons and intermittent cigar use.  He had undergone laboratory on April 27, 2023 which showed stable hemoglobin at 14.4 with hematocrit 42.9.  TSH was 3.06.  Total cholesterol was 150 with LDL cholesterol 76 triglycerides 97 HDL 56.  BUN is 14 creatinine 1.24 and glucose 96.  A recent chest x-ray on May 12, 2023 did not show any  active cardiopulmonary disease.  He has recently experiences some chest heaviness and at times has a nervous feeling.  He admits to increased fatigue and his episodes of lightheadedness have improved.  He is scheduled to undergo CT of his abdomen and pelvis due to complaints which he voiced to Dr. Clelia Croft.  Because of his recent episodes of symptomatic bradycardia he is now referred for cardiology evaluation.   Past Medical History:  Diagnosis Date   Benign hypertension with CKD (chronic kidney disease) stage I    Chronic fatigue syndrome with fibromyalgia    SEEN BY [facility] (DR Dareen Piano)   CKD (chronic kidney disease) stage 3, GFR 30-59 ml/min (HCC)    Hiatal hernia    Hyperlipidemia    Insomnia    Obesity    Prostate cancer (HCC) 2004   DR. OTTELIN   Sickle cell trait (HCC)    Sleep apnea    WEARS CPAP   Tobacco abuse     No past surgical history on file.  Current Medications: Outpatient Medications Prior to Visit  Medication Sig Dispense Refill   acetaminophen (TYLENOL) 500 MG tablet Take 500 mg by mouth every 6 (six) hours as needed for mild pain.     aspirin 81 MG chewable tablet Chew 81 mg by mouth daily.     omeprazole (PRILOSEC OTC) 20 MG tablet Take 20 mg by mouth daily.     simvastatin (ZOCOR) 40 MG tablet Take 40 mg by mouth daily.     hydrochlorothiazide (HYDRODIURIL) 25 MG  tablet Take 25 mg by mouth daily. (Patient not taking: Reported on 05/29/2023)     No facility-administered medications prior to visit.     Allergies:   Patient has no known allergies.   Social History   Socioeconomic History   Marital status: Married    Spouse name: Not on file   Number of children: 3   Years of education: Not on file   Highest education level: Not on file  Occupational History   Occupation: RETIRED  Tobacco Use   Smoking status: Some Days    Current packs/day: 0.00    Types: Cigarettes, Cigars    Last attempt to quit: 02/11/2016    Years since quitting: 7.3   Smokeless  tobacco: Never  Vaping Use   Vaping status: Never Used  Substance and Sexual Activity   Alcohol use: No   Drug use: No   Sexual activity: Not on file  Other Topics Concern   Not on file  Social History Narrative   Not on file   Social Determinants of Health   Financial Resource Strain: Not on file  Food Insecurity: Not on file  Transportation Needs: Not on file  Physical Activity: Not on file  Stress: Not on file  Social Connections: Not on file     Socially he is divorced and currently single.  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  He previously smoked cigarettes and quit in 2007 teen.  He does smoke intermittent cigars.  He does not drink alcohol.  He denies any drug use.  He remains active with walking.  Family History: Both parents are deceased.  Mother died at age 45 and had suffered a myocardial infarction.  Father died at age 49 and had heart issues.  He has 1 living brother age 51.  He has 4 daughters ages 66, 94, 17 and.  ROS General: Negative; No fevers, chills, or night sweats;  HEENT: Negative; No changes in vision or hearing, sinus congestion, difficulty swallowing Pulmonary: Negative; No cough, wheezing, shortness of breath, hemoptysis Cardiovascular: See HPI, recent bradycardia, mild lightheadedness, nonexertional chest pressure GI: Negative; No nausea, vomiting, diarrhea, or abdominal pain GU: Negative; No dysuria, hematuria, or difficulty voiding Musculoskeletal: Negative; no myalgias, joint pain, or weakness Hematologic/Oncology: Negative; no easy bruising, bleeding Endocrine: Negative; no heat/cold intolerance; no diabetes Neuro: Negative; no changes in balance, headaches Skin: Negative; No rashes or skin lesions Psychiatric: Negative; No behavioral problems, depression Sleep: Negative; No snoring, daytime sleepiness, hypersomnolence, bruxism, restless legs, hypnogognic hallucinations, no cataplexy Other comprehensive 14 point  system review is negative.   PHYSICAL EXAM:   VS:  BP 136/78 (BP Location: Left Arm, Patient Position: Sitting, Cuff Size: Normal)   Pulse (!) 57   Ht 5\' 10"  (1.778 m)   Wt 212 lb (96.2 kg)   SpO2 96%   BMI 30.42 kg/m     Repeat blood pressure by me was 138/78   Wt Readings from Last 3 Encounters:  05/29/23 212 lb (96.2 kg)  04/30/17 218 lb (98.9 kg)  03/10/17 218 lb (98.9 kg)    General: Alert, oriented, no distress.  Skin: normal turgor, no rashes, warm and dry HEENT: Normocephalic, atraumatic. Pupils equal round and reactive to light; sclera anicteric; extraocular muscles intact;  Nose without nasal septal hypertrophy Mouth/Parynx benign; Mallinpatti scale 3 Neck: No JVD, no carotid bruits; normal carotid upstroke Lungs: clear to ausculatation and percussion; no wheezing or rales Chest wall: without tenderness to palpitation  Heart: PMI not displaced, RRR, s1 s2 normal, 1/6 systolic murmur, no diastolic murmur, no rubs, gallops, thrills, or heaves Abdomen: soft, nontender; no hepatosplenomehaly, BS+; abdominal aorta nontender and not dilated by palpation. Back: no CVA tenderness Pulses 2+ Musculoskeletal: full range of motion, normal strength, no joint deformities Extremities: no clubbing cyanosis or edema, Homan's sign negative  Neurologic: grossly nonfocal; Cranial nerves grossly wnl Psychologic: Normal mood and affect   Studies/Labs Reviewed:   EKG Interpretation Date/Time:  Friday May 29 2023 15:00:00 EDT Ventricular Rate:  57 PR Interval:  146 QRS Duration:  94 QT Interval:  442 QTC Calculation: 430 R Axis:   -16  Text Interpretation: Sinus bradycardia Incomplete right bundle branch block When compared with ECG of 11-Jun-2011 18:49, No significant change was found Confirmed by Nicki Guadalajara ([id number]) on 05/29/2023 3:28:58 PM    Recent Labs:     No data to display               No data to display              No data to display          No results found for: "MCV" No results found for: "TSH" No results found for: "HGBA1C"   BNP No results found for: "BNP"  ProBNP No results found for: "PROBNP"   Lipid Panel  No results found for: "CHOL", "TRIG", "HDL", "CHOLHDL", "VLDL", "LDLCALC", "LDLDIRECT", "LABVLDL"   RADIOLOGY: DG Chest 2 View  Result Date: 05/19/2023 CLINICAL DATA:  Chest pressure. Decreased blood pressure over the last 6 weeks. EXAM: CHEST - 2 VIEW COMPARISON:  04/09/2017.  CT, 04/08/2006. FINDINGS: Cardiac silhouette is normal in size and configuration. Normal mediastinal and hilar contours. Clear lungs.  No pleural effusion or pneumothorax. Skeletal structures are intact. IMPRESSION: No active cardiopulmonary disease. Electronically Signed   By: Amie Portland M.D.   On: 05/19/2023 14:13     Additional studies/ records that were reviewed today include:  I have reviewed the prior evaluation from Dr. Admission in 2018.  Records of Dr. Clelia Croft at [place] were reviewed.    ASSESSMENT:    1. Symptomatic bradycardia   2. Chest pain, unspecified type   3. Incomplete right bundle branch block   4. Abdominal pain, unspecified abdominal location     PLAN:  Mr. Arthur Hardy is a 72 year old gentleman who has a remote tobacco history, and previously had undergone cardiac catheterization in 2007 and was found to have normal coronary arteries and LV function.  An echo Doppler study in 2018 showed normal LV function with EF 60 to 65% without wall motion abnormalities.  There was mild calcification of his aortic valve annulus.  He has remained fairly active with walking.  In mid July, he had experienced 4 days of lightheadedness with mild headache and fatigability.  His blood pressure was low and at that time it was recommended he discontinue hydrochlorothiazide which she had been on for hypertension leg swelling.  He has felt improved with discontinuation of therapy.  He has continued to experience fatigability.  His  heart rates have run in the 40s to 50s recently.  At times he has noticed some vague chest heaviness particularly when he feels a nervous sensation.  However he is able to walk without chest pain development.  I reviewed the laboratory done by Dr. Cam Hai.  Presently, I am recommending he undergo a 6-year follow-up echo Doppler study for reassessment of LV systolic and diastolic function.  His  ECG today shows sinus bradycardia 57 bpm with incomplete right bundle branch block.  With his recent episodes of chest pressure, I have suggested that he undergo coronary CTA for assessment of coronary calcification and percent luminal stenosis of CAD if present.  With his episodes of left hide in this and bradycardia I have also recommended he wear a 14-day Zio patch monitor.  He apparently has been scheduled to undergo CT of his abdomen and pelvis by Dr. Cam Hai due to some abdominal discomforts.  I will contact him regarding the results of the above studies.  I will see him in 2 to 3 months for reevaluation or sooner as needed.   Medication Adjustments/Labs and Tests Ordered: Current medicines are reviewed at length with the patient today.  Concerns regarding medicines are outlined above.  Medication changes, Labs and Tests ordered today are listed in the Patient Instructions below. Patient Instructions  Medication Instructions:  *If you need a refill on your cardiac medications before your next appointment, please call your pharmacy*   Lab Work:  If you have labs (blood work) drawn today and your tests are completely normal, you will receive your results only by: MyChart Message (if you have MyChart) OR A paper copy in the mail If you have any lab test that is abnormal or we need to change your treatment, we will call you to review the results.   Testing/Procedures: Your physician has requested that you have an echocardiogram. Echocardiography is a painless test that uses sound waves to  create images of your heart. It provides your doctor with information about the size and shape of your heart and how well your heart's chambers and valves are working. This procedure takes approximately one hour. There are no restrictions for this procedure. Please do NOT wear cologne, perfume, aftershave, or lotions (deodorant is allowed).   Please arrive 15 minutes prior to your appointment time.         Your cardiac CT will be scheduled at one of the below locations:   [facility] [address] [place], [place] [address] [phone]    If scheduled at [facility], please arrive at the [facility] and Children's Entrance (Entrance C2) of [facility] 30 minutes prior to test start time. You can use the FREE valet parking offered at entrance C (encouraged to control the heart rate for the test)  Proceed to the [facility] Radiology Department (first floor) to check-in and test prep.  All radiology patients and guests should use entrance C2 at [facility], accessed from [facility], even though the hospital's physical address listed is [address].    If scheduled at [facility] or [facility], please arrive 15 mins early for check-in and test prep.  There is spacious parking and easy access to the radiology department from the [facility] Vascular entrance. Please enter here and check-in with the desk attendant.   Please follow these instructions carefully (unless otherwise directed):  An IV will be required for this test and Nitroglycerin will be given.  Hold all erectile dysfunction medications at least 3 days (72 hrs) prior to test. (Ie viagra, cialis, sildenafil, tadalafil, etc)   On the Night Before the Test: Be sure to Drink plenty of water. Do not consume any caffeinated/decaffeinated beverages or chocolate 12 hours prior to your test. Do not take any  antihistamines 12 hours prior to your test. If the patient  has contrast allergy: Patient will need a prescription for Prednisone and very clear instructions (as follows): Prednisone 50 mg - take 13 hours prior to test Take another Prednisone 50 mg 7 hours prior to test Take another Prednisone 50 mg 1 hour prior to test Take Benadryl 50 mg 1 hour prior to test Patient must complete all four doses of above prophylactic medications. Patient will need a ride after test due to Benadryl.  On the Day of the Test: Drink plenty of water until 1 hour prior to the test. Do not eat any food 1 hour prior to test. You may take your regular medications prior to the test.  Take metoprolol (Lopressor) two hours prior to test. If you take Furosemide/Hydrochlorothiazide/Spironolactone, please HOLD on the morning of the test. FEMALES- please wear underwire-free bra if available, avoid dresses & tight clothing        After the Test: Drink plenty of water. After receiving IV contrast, you may experience a mild flushed feeling. This is normal. On occasion, you may experience a mild rash up to 24 hours after the test. This is not dangerous. If this occurs, you can take Benadryl 25 mg and increase your fluid intake. If you experience trouble breathing, this can be serious. If it is severe call 911 IMMEDIATELY. If it is mild, please call our office. If you take any of these medications: Glipizide/Metformin, Avandament, Glucavance, please do not take 48 hours after completing test unless otherwise instructed.  We will call to schedule your test 2-4 weeks out understanding that some insurance companies will need an authorization prior to the service being performed.   For more information and frequently asked questions, please visit our website : [email or web address]  For non-scheduling related questions, please contact the cardiac imaging nurse navigator should you have any  questions/concerns: Cardiac Imaging Nurse Navigators Direct Office Dial: [phone]   For scheduling needs, including cancellations and rescheduling, please call [place], [phone].        ZIO XT- Long Term Monitor Instructions   Your physician has requested you wear your ZIO patch monitor___14____days.   This is a single patch monitor.  [employer] supplies one patch monitor per enrollment.  Additional stickers are not available.   Please do not apply patch if you will be having a Nuclear Stress Test, Echocardiogram, Cardiac CT, MRI, or Chest Xray during the time frame you would be wearing the monitor. The patch cannot be worn during these tests.  You cannot remove and re-apply the ZIO XT patch monitor.   Your ZIO patch monitor will be sent USPS Priority mail from [facility] directly to your home address. The monitor may also be mailed to a PO BOX if home delivery is not available.   It may take 3-5 days to receive your monitor after you have been enrolled.   Once you have received you monitor, please review enclosed instructions.  Your monitor has already been registered assigning a specific monitor serial # to you.   Applying the monitor   Shave hair from upper left chest.   Hold abrader disc by orange tab.  Rub abrader in 40 strokes over left upper chest as indicated in your monitor instructions.   Clean area with 4 enclosed alcohol pads .  Use all pads to assure are is cleaned thoroughly.  Let dry.   Apply patch as indicated in monitor instructions.  Patch will be place under collarbone on left side of chest with arrow pointing upward.   Rub patch  adhesive wings for 2 minutes.Remove white label marked "1".  Remove white label marked "2".  Rub patch adhesive wings for 2 additional minutes.   While looking in a mirror, press and release button in center of patch.  A small green light will flash 3-4 times .  This will be your only indicator the monitor has been turned  on.     Do not shower for the first 24 hours.  You may shower after the first 24 hours.   Press button if you feel a symptom. You will hear a small click.  Record Date, Time and Symptom in the Patient Log Book.   When you are ready to remove patch, follow instructions on last 2 pages of Patient Log Book.  Stick patch monitor onto last page of Patient Log Book.   Place Patient Log Book in [place] box.  Use locking tab on box and tape box closed securely.  The [employer] and [employer] has [employer] on it.  Please place in mailbox as soon as possible.  Your physician should have your test results approximately 7 days after the monitor has been mailed back to [facility].   Call [facility] Customer Care at [phone] if you have questions regarding your ZIO XT patch monitor.  Call them immediately if you see an orange light blinking on your monitor.   If your monitor falls off in less than 4 days contact our Monitor department at [phone].  If your monitor becomes loose or falls off after 4 days call [employer] at [phone] for suggestions on securing your monitor.     Follow-Up: At [facility], you and your health needs are our priority.  As part of our continuing mission to provide you with exceptional heart care, we have created designated Provider Care Teams.  These Care Teams include your primary Cardiologist (physician) and Advanced Practice Providers (APPs -  Physician Assistants and Nurse Practitioners) who all work together to provide you with the care you need, when you need it.  We recommend signing up for the patient portal called "MyChart".  Sign up information is provided on this After Visit Summary.  MyChart is used to connect with patients for Virtual Visits (Telemedicine).  Patients are able to view lab/test results, encounter notes, upcoming appointments, etc.  Non-urgent messages can be sent to your provider as well.   To learn more about what you can do  with MyChart, go to [email or web address].    Your next appointment:   2 month(s)  Provider:   Dr. Nicki Guadalajara       Signed, Nicki Guadalajara, MD  06/01/2023 5:51 PM    [facility] [address], [address], [place], [place]  [address] Phone: [phone]

## 2023-05-29 NOTE — Patient Instructions (Signed)
Medication Instructions:  *If you need a refill on your cardiac medications before your next appointment, please call your pharmacy*   Lab Work:  If you have labs (blood work) drawn today and your tests are completely normal, you will receive your results only by: MyChart Message (if you have MyChart) OR A paper copy in the mail If you have any lab test that is abnormal or we need to change your treatment, we will call you to review the results.   Testing/Procedures: Your physician has requested that you have an echocardiogram. Echocardiography is a painless test that uses sound waves to create images of your heart. It provides your doctor with information about the size and shape of your heart and how well your heart's chambers and valves are working. This procedure takes approximately one hour. There are no restrictions for this procedure. Please do NOT wear cologne, perfume, aftershave, or lotions (deodorant is allowed).   Please arrive 15 minutes prior to your appointment time.         Your cardiac CT will be scheduled at one of the below locations:   [facility] [address] [place], [place] [address] [phone]    If scheduled at [facility], please arrive at the [facility] and Children's Entrance (Entrance C2) of [facility] 30 minutes prior to test start time. You can use the FREE valet parking offered at entrance C (encouraged to control the heart rate for the test)  Proceed to the [facility] Radiology Department (first floor) to check-in and test prep.  All radiology patients and guests should use entrance C2 at [facility], accessed from [facility], even though the hospital's physical address listed is [address]    If scheduled at [facility] or [facility], please arrive 15 mins early for check-in and test prep.  There is spacious parking and easy  access to the radiology department from the [facility] Heart and V[facility] entrance. Please enter here and check-in with the desk attendant.   Please follow these instructions carefully (unless otherwise directed):  An IV will be required for this test and Nitroglycerin will be given.  Hold all erectile dysfunction medications at least 3 days (72 hrs) prior to test. (Ie viagra, cialis, sildenafil, tadalafil, etc)   On the Night Before the Test: Be sure to Drink plenty of water. Do not consume any caffeinated/decaffeinated beverages or chocolate 12 hours prior to your test. Do not take any antihistamines 12 hours prior to your test. If the patient has contrast allergy: Patient will need a prescription for Prednisone and very clear instructions (as follows): Prednisone 50 mg - take 13 hours prior to test Take another Prednisone 50 mg 7 hours prior to test Take another Prednisone 50 mg 1 hour prior to test Take Benadryl 50 mg 1 hour prior to test Patient must complete all four doses of above prophylactic medications. Patient will need a ride after test due to Benadryl.  On the Day of the Test: Drink plenty of water until 1 hour prior to the test. Do not eat any food 1 hour prior to test. You may take your regular medications prior to the test.  Take metoprolol (Lopressor) two hours prior to test. If you take Furosemide/Hydrochlorothiazide/Spironolactone, please HOLD on the morning of the test. FEMALES- please wear underwire-free bra if available, avoid dresses & tight clothing        After the Test: Drink plenty  of water. After receiving IV contrast, you may experience a mild flushed feeling. This is normal. On occasion, you may experience a mild rash up to 24 hours after the test. This is not dangerous. If this occurs, you can take Benadryl 25 mg and increase your fluid intake. If you experience trouble breathing, this can be serious. If it is severe call 911 IMMEDIATELY. If it is mild,  please call our office. If you take any of these medications: Glipizide/Metformin, Avandament, Glucavance, please do not take 48 hours after completing test unless otherwise instructed.  We will call to schedule your test 2-4 weeks out understanding that some insurance companies will need an authorization prior to the service being performed.   For more information and frequently asked questions, please visit our website : [email or web address]  For non-scheduling related questions, please contact the cardiac imaging nurse navigator should you have any questions/concerns: Cardiac Imaging Nurse Navigators Direct Office Dial: [phone]   For scheduling needs, including cancellations and rescheduling, please call [place], [phone].        ZIO XT- Long Term Monitor Instructions   Your physician has requested you wear your ZIO patch monitor___14____days.   This is a single patch monitor.  [employer] supplies one patch monitor per enrollment.  Additional stickers are not available.   Please do not apply patch if you will be having a Nuclear Stress Test, Echocardiogram, Cardiac CT, MRI, or Chest Xray during the time frame you would be wearing the monitor. The patch cannot be worn during these tests.  You cannot remove and re-apply the ZIO XT patch monitor.   Your ZIO patch monitor will be sent USPS Priority mail from [facility] directly to your home address. The monitor may also be mailed to a PO BOX if home delivery is not available.   It may take 3-5 days to receive your monitor after you have been enrolled.   Once you have received you monitor, please review enclosed instructions.  Your monitor has already been registered assigning a specific monitor serial # to you.   Applying the monitor   Shave hair from upper left chest.   Hold abrader disc by orange tab.  Rub abrader in 40 strokes over left upper chest as indicated in your monitor instructions.   Clean  area with 4 enclosed alcohol pads .  Use all pads to assure are is cleaned thoroughly.  Let dry.   Apply patch as indicated in monitor instructions.  Patch will be place under collarbone on left side of chest with arrow pointing upward.   Rub patch adhesive wings for 2 minutes.Remove white label marked "1".  Remove white label marked "2".  Rub patch adhesive wings for 2 additional minutes.   While looking in a mirror, press and release button in center of patch.  A small green light will flash 3-4 times .  This will be your only indicator the monitor has been turned on.     Do not shower for the first 24 hours.  You may shower after the first 24 hours.   Press button if you feel a symptom. You will hear a small click.  Record Date, Time and Symptom in the Patient Log Book.   When you are ready to remove patch, follow instructions on last 2 pages of Patient Log Book.  Stick patch monitor onto last page of Patient Log Book.   Place Patient Log Book in [place] box.  Use locking tab on box and tape box  closed securely.  The [employer] has [employer] on it.  Please place in mailbox as soon as possible.  Your physician should have your test results approximately 7 days after the monitor has been mailed back to [facility].   Call [facility] Customer Care at [phone] if you have questions regarding your ZIO XT patch monitor.  Call them immediately if you see an orange light blinking on your monitor.   If your monitor falls off in less than 4 days contact our Monitor department at [phone].  If your monitor becomes loose or falls off after 4 days call [employer] at [phone] for suggestions on securing your monitor.     Follow-Up: At [facility], you and your health needs are our priority.  As part of our continuing mission to provide you with exceptional heart care, we have created designated Provider Care Teams.  These Care Teams include your primary  Cardiologist (physician) and Advanced Practice Providers (APPs -  Physician Assistants and Nurse Practitioners) who all work together to provide you with the care you need, when you need it.  We recommend signing up for the patient portal called "MyChart".  Sign up information is provided on this After Visit Summary.  MyChart is used to connect with patients for Virtual Visits (Telemedicine).  Patients are able to view lab/test results, encounter notes, upcoming appointments, etc.  Non-urgent messages can be sent to your provider as well.   To learn more about what you can do with MyChart, go to [email or web address].    Your next appointment:   2 month(s)  Provider:   Dr. Nicki Guadalajara

## 2023-06-01 ENCOUNTER — Encounter: Payer: Self-pay | Admitting: Cardiovascular Disease

## 2023-06-01 ENCOUNTER — Ambulatory Visit: Payer: PPO | Attending: Cardiovascular Disease

## 2023-06-01 DIAGNOSIS — I495 Sick sinus syndrome: Secondary | ICD-10-CM

## 2023-06-01 NOTE — Progress Notes (Unsigned)
Enrolled for [employer] to mail a ZIO XT long term holter monitor to the patients address on file.  

## 2023-06-02 ENCOUNTER — Ambulatory Visit
Admission: RE | Admit: 2023-06-02 | Discharge: 2023-06-02 | Disposition: A | Discharge status: Home / Self Care | Payer: PPO | Source: Ambulatory Visit | Attending: Family Medicine | Admitting: Family Medicine

## 2023-06-02 ENCOUNTER — Telehealth (HOSPITAL_COMMUNITY): Payer: Self-pay | Admitting: *Deleted

## 2023-06-02 DIAGNOSIS — I700 Atherosclerosis of aorta: Secondary | ICD-10-CM | POA: Diagnosis not present

## 2023-06-02 DIAGNOSIS — R109 Unspecified abdominal pain: Secondary | ICD-10-CM | POA: Diagnosis not present

## 2023-06-02 DIAGNOSIS — K5730 Diverticulosis of large intestine without perforation or abscess without bleeding: Secondary | ICD-10-CM | POA: Diagnosis not present

## 2023-06-02 MED ORDER — IOPAMIDOL (ISOVUE-300) INJECTION 61%
500.0000 mL | Freq: Once | INTRAVENOUS | Status: AC | PRN
  Administered 2023-06-02: 100 mL via INTRAVENOUS

## 2023-06-02 NOTE — Telephone Encounter (Signed)
Reaching out to patient to offer assistance regarding upcoming cardiac imaging study; pt verbalizes understanding of appt date/time, parking situation and where to check in, pre-test NPO status, and verified current allergies; name and call back number provided for further questions should they arise  Larey Brick RN Navigator Cardiac Imaging [facility] [phone] office [phone] cell  Patient aware to arrive at 12 PM.

## 2023-06-04 ENCOUNTER — Ambulatory Visit (HOSPITAL_COMMUNITY)
Admission: RE | Admit: 2023-06-04 | Discharge: 2023-06-04 | Disposition: A | Discharge status: Home / Self Care | Payer: PPO | Source: Ambulatory Visit | Attending: Cardiovascular Disease | Admitting: Cardiovascular Disease

## 2023-06-04 DIAGNOSIS — I2510 Atherosclerotic heart disease of native coronary artery without angina pectoris: Secondary | ICD-10-CM | POA: Insufficient documentation

## 2023-06-04 DIAGNOSIS — R079 Chest pain, unspecified: Secondary | ICD-10-CM | POA: Diagnosis not present

## 2023-06-04 DIAGNOSIS — I495 Sick sinus syndrome: Secondary | ICD-10-CM | POA: Diagnosis not present

## 2023-06-04 MED ORDER — NITROGLYCERIN 0.4 MG SL SUBL
SUBLINGUAL_TABLET | SUBLINGUAL | Status: AC
  Filled 2023-06-04: qty 2

## 2023-06-04 MED ORDER — IOHEXOL 350 MG/ML SOLN
95.0000 mL | Freq: Once | INTRAVENOUS | Status: AC | PRN
  Administered 2023-06-04: 95 mL via INTRAVENOUS

## 2023-06-04 MED ORDER — NITROGLYCERIN 0.4 MG SL SUBL
0.8000 mg | SUBLINGUAL_TABLET | Freq: Once | SUBLINGUAL | Status: AC
  Administered 2023-06-04: 0.8 mg via SUBLINGUAL

## 2023-06-17 ENCOUNTER — Ambulatory Visit (HOSPITAL_COMMUNITY): Payer: PPO | Attending: Cardiovascular Disease

## 2023-06-17 DIAGNOSIS — R079 Chest pain, unspecified: Secondary | ICD-10-CM | POA: Insufficient documentation

## 2023-06-17 LAB — ECHOCARDIOGRAM COMPLETE
Area-P 1/2: 3.5 cm2
S' Lateral: 3.1 cm

## 2023-06-17 MED ORDER — PERFLUTREN LIPID MICROSPHERE
1.0000 mL | INTRAVENOUS | Status: AC | PRN
Start: 2023-06-17 — End: 2023-06-17
  Administered 2023-06-17: 1 mL via INTRAVENOUS

## 2023-06-23 DIAGNOSIS — I495 Sick sinus syndrome: Secondary | ICD-10-CM | POA: Diagnosis not present

## 2023-07-14 ENCOUNTER — Ambulatory Visit: Payer: PPO | Admitting: Cardiovascular Disease

## 2023-07-16 ENCOUNTER — Ambulatory Visit: Payer: PPO | Attending: Cardiovascular Disease | Admitting: Cardiovascular Disease

## 2023-07-16 ENCOUNTER — Encounter: Payer: Self-pay | Admitting: Cardiovascular Disease

## 2023-07-16 DIAGNOSIS — E785 Hyperlipidemia, unspecified: Secondary | ICD-10-CM | POA: Diagnosis not present

## 2023-07-16 DIAGNOSIS — I4510 Unspecified right bundle-branch block: Secondary | ICD-10-CM | POA: Diagnosis not present

## 2023-07-16 DIAGNOSIS — R0789 Other chest pain: Secondary | ICD-10-CM

## 2023-07-16 DIAGNOSIS — I495 Sick sinus syndrome: Secondary | ICD-10-CM | POA: Diagnosis not present

## 2023-07-16 DIAGNOSIS — G4733 Obstructive sleep apnea (adult) (pediatric): Secondary | ICD-10-CM

## 2023-07-16 DIAGNOSIS — R079 Chest pain, unspecified: Secondary | ICD-10-CM | POA: Diagnosis not present

## 2023-07-16 DIAGNOSIS — R0683 Snoring: Secondary | ICD-10-CM | POA: Diagnosis not present

## 2023-07-16 DIAGNOSIS — Z87891 Personal history of nicotine dependence: Secondary | ICD-10-CM | POA: Diagnosis not present

## 2023-07-16 MED ORDER — EZETIMIBE 10 MG PO TABS: 10.0000 mg | ORAL_TABLET | Freq: Every day | ORAL | 3 refills | Status: AC

## 2023-07-16 MED ORDER — METOPROLOL TARTRATE 25 MG PO TABS: ORAL_TABLET | ORAL | 3 refills | Status: AC

## 2023-07-16 NOTE — Progress Notes (Signed)
Cardiology Office Note    Date:  07/17/2023   ID:  Arthur Hardy, DOB 1950/09/21, MRN [id number]  PCP:  Lupita Raider, MD  Cardiologist:  Nicki Guadalajara, MD   6 week F/U cardiology evaluation referred through the courtesy of Dr. Cam Hai for evaluation of symptomatic bradycardia and episodic vague chest heaviness.   History of Present Illness:  Arthur Hardy is a 72 y.o. male who is followed by Dr. Cam Hai at [facility] for primary care.  The 2018, he had seen Dr. Charlton Haws for cardiology evaluation.  He has a history of hypertension, family history for CAD, hyperlipidemia, and prior tobacco use.  Remotely in 2007 he had a nuclear perfusion study which showed small inferoseptal defect most likely diaphragmatic attenuation.  He had undergone cardiac catheterization by Dr. Mayford Knife which demonstrated normal coronary arteries with normal EF and pressures.  Remotely he had transiently used CPAP therapy for obstructive sleep apnea but stopped therapy years ago.  Recently, he was evaluated by Dr. Cam Hai.  He has had issues with orthostatic hypotension and bradycardia which seem to occur fairly suddenly on March 26, 2023.  He had had weight loss from dietary adjustment and at that time had seen Dr. Judithann Sheen who recommended he stop taking hydrochlorothiazide.  He has felt better with the discontinuance of hydrochlorothiazide.  He had seen Dr. Clelia Croft in follow-up and oftentimes heart rates have been in the 40s to 50s at home with blood pressures in the 100-140/30-50s.  He has been wearing oxygen at night.  He has a remote history of prostate cancer treated by Dr. Vernie Ammons and intermittent cigar use.  He had undergone laboratory on April 27, 2023 which showed stable hemoglobin at 14.4 with hematocrit 42.9.  TSH was 3.06.  Total cholesterol was 150 with LDL cholesterol 76 triglycerides 97 HDL 56.  BUN is 14 creatinine 1.24 and glucose 96.  A recent chest x-ray on May 12, 2023 did not  show any active cardiopulmonary disease.  He has recently experiences some chest heaviness and at times has a nervous feeling.  He admits to increased fatigue and his episodes of lightheadedness have improved.  He is scheduled to undergo CT of his abdomen and pelvis due to complaints which he voiced to Dr. Clelia Croft.  With his recent episodes of symptomatic bradycardia he was now referred for cardiology evaluation.  During his May 29, 2023 initial evaluation, I recommended he undergo a 6-year follow-up echo Doppler study for reassessment of LV systolic and diastolic function.  With his chest pressure I suggested he undergo coronary CTA for assessment of coronary calcification and percent luminal stenosis.  I also recommended he wear a 14-day Zio patch monitor.  He had already been scheduled to undergo CT of his abdomen and pelvis by Dr. Clelia Croft.  Coronary CTA on June 04, 2023 showed no significant extracardiac findings with mild dependent atelectasis of both lungs.  Calcium score was 117 representing 38th percentile.  Total plaque volume was moderate with calcified plaque at 17mm3 and noncalcified plaque 172 mm3.  A 2D echo Doppler study on June 17, 2023 showed normal LV function with EF 60 to 65% with grade 2 diastolic dysfunction.  There was mild LVH without regional wall motion abnormalities.  There was mild aortic sclerosis without stenosis.  There was borderline dilatation of aortic root at 41 mm and ascending aorta at 40 mm.  He wore a Zio patch monitor which showed an average heart rate  at 53 with minimal heart rate at 38 and maximum heart rate at 141.  Predominant rhythm was sinus with slight P wave morphology changes being noted.  He had 4 brief episodes of SVT with the fastest interval lasting 8 beats at a rate of 141 there were rare isolated PACs couplets and triplets with rare isolated PVCs and couplets.  Presently, he has continued to experience some episodes of chest discomfort.  He also  admits to significant fatigability particularly in the afternoon.  He typically goes to bed around 1030 and wakes up at 5:30 AM.  His resting pulse typically is in the 50s but he does experience episodes of transient increased palpitations.  He had undergone laboratory in August 2024 which showed total cholesterol 150, HDL 56, LDL 76, triglycerides 97.  He presents to the office today with his daughter who is a Engineer, civil (consulting) and works at [employer] and is an Scientist, research (medical).   Past Medical History:  Diagnosis Date   Benign hypertension with CKD (chronic kidney disease) stage I    Chronic fatigue syndrome with fibromyalgia    SEEN BY [facility] (DR Dareen Piano)   CKD (chronic kidney disease) stage 3, GFR 30-59 ml/min (HCC)    Hiatal hernia    Hyperlipidemia    Insomnia    Obesity    Prostate cancer (HCC) 2004   DR. OTTELIN   Sickle cell trait (HCC)    Sleep apnea    WEARS CPAP   Tobacco abuse     No past surgical history on file.  Current Medications: Outpatient Medications Prior to Visit  Medication Sig Dispense Refill   acetaminophen (TYLENOL) 500 MG tablet Take 500-1,000 mg by mouth every 6 (six) hours as needed for mild pain (pain score 1-3).     aspirin 81 MG chewable tablet Chew 81 mg by mouth daily.     omeprazole (PRILOSEC OTC) 20 MG tablet Take 20 mg by mouth daily.     simvastatin (ZOCOR) 40 MG tablet Take 40 mg by mouth daily.     metoprolol tartrate (LOPRESSOR) 25 MG tablet Take 1 tablet (25 mg total) by mouth once for 1 dose. Take one tablet 2 hours before the CT procedure 1 tablet 0   No facility-administered medications prior to visit.     Allergies:   Patient has no known allergies.   Social History   Socioeconomic History   Marital status: Married    Spouse name: Not on file   Number of children: 3   Years of education: Not on file   Highest education level: Not on file  Occupational History   Occupation: RETIRED  Tobacco Use   Smoking status: Some Days     Current packs/day: 0.00    Types: Cigarettes, Cigars    Last attempt to quit: 02/11/2016    Years since quitting: 7.4   Smokeless tobacco: Never  Vaping Use   Vaping status: Never Used  Substance and Sexual Activity   Alcohol use: No   Drug use: No   Sexual activity: Not on file  Other Topics Concern   Not on file  Social History Narrative   Not on file   Social Determinants of Health   Financial Resource Strain: Not on file  Food Insecurity: Not on file  Transportation Needs: Not on file  Physical Activity: Not on file  Stress: Not on file  Social Connections: Not on file     Socially he is divorced and currently single.  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  He previously smoked cigarettes and quit in 2007 teen.  He does smoke intermittent cigars.  He does not drink alcohol.  He denies any drug use.  He remains active with walking.  Family History: Both parents are deceased.  Mother died at age 68 and had suffered a myocardial infarction.  Father died at age 26 and had heart issues.  He has 1 living brother age 50.  He has 4 daughters ages 66, 61, 78 and mid 52s.  ROS General: Negative; No fevers, chills, or night sweats;  HEENT: Negative; No changes in vision or hearing, sinus congestion, difficulty swallowing Pulmonary: Negative; No cough, wheezing, shortness of breath, hemoptysis Cardiovascular: See HPI, recent bradycardia, mild lightheadedness, nonexertional chest pressure GI: Negative; No nausea, vomiting, diarrhea, or abdominal pain GU: Negative; No dysuria, hematuria, or difficulty voiding Musculoskeletal: Negative; no myalgias, joint pain, or weakness Hematologic/Oncology: Negative; no easy bruising, bleeding Endocrine: Negative; no heat/cold intolerance; no diabetes Neuro: Negative; no changes in balance, headaches Skin: Negative; No rashes or skin lesions Psychiatric: Negative; No behavioral problems, depression Sleep: Significant  fatigability particularly in the afternoon.  Positive snoring; no bruxism, restless legs, hypnogognic hallucinations, no cataplexy Other comprehensive 14 point system review is negative.   PHYSICAL EXAM:   VS:  BP 110/70   Pulse (!) 55   Ht 5\' 10"  (1.778 m)   Wt 218 lb (98.9 kg)   SpO2 98%   BMI 31.28 kg/m     Repeat blood pressure by me was 136/76   Wt Readings from Last 3 Encounters:  07/16/23 218 lb (98.9 kg)  05/29/23 212 lb (96.2 kg)  04/30/17 218 lb (98.9 kg)    General: Alert, oriented, no distress.  Bearded Skin: normal turgor, no rashes, warm and dry HEENT: Normocephalic, atraumatic. Pupils equal round and reactive to light; sclera anicteric; extraocular muscles intact; Nose without nasal septal hypertrophy 3 Mouth/Parynx benign; Mallinpatti scale Neck: No JVD, no carotid bruits; normal carotid upstroke Lungs: clear to ausculatation and percussion; no wheezing or rales Chest wall: without tenderness to palpitation Heart: PMI not displaced, RRR, s1 s2 normal, 1/6 systolic murmur, no diastolic murmur, no rubs, gallops, thrills, or heaves Abdomen: soft, nontender; no hepatosplenomehaly, BS+; abdominal aorta nontender and not dilated by palpation. Back: no CVA tenderness Pulses 2+ Musculoskeletal: full range of motion, normal strength, no joint deformities Extremities: no clubbing cyanosis or edema, Homan's sign negative  Neurologic: grossly nonfocal; Cranial nerves grossly wnl Psychologic: Normal mood and affect   Studies/Labs Reviewed:   EKG Interpretation Date/Time:  Thursday July 16 2023 12:11:28 EST Ventricular Rate:  55 PR Interval:  140 QRS Duration:  100 QT Interval:  438 QTC Calculation: 419 R Axis:   21  Text Interpretation: Sinus bradycardia When compared with ECG of 29-May-2023 15:00, No significant change was found Confirmed by Nicki Guadalajara ([id number]) on 07/16/2023 1:03:50 PM    Recent Labs:    Latest Ref Rng & Units 07/16/2023    2:14 PM  BMP   Glucose 70 - 99 mg/dL 86   BUN 8 - 27 mg/dL 10   Creatinine 6.57 - 1.27 mg/dL 8.46   BUN/Creat Ratio 10 - 24 9   Sodium 134 - 144 mmol/L 141   Potassium 3.5 - 5.2 mmol/L 4.3   Chloride 96 - 106 mmol/L 104   CO2 20 - 29 mmol/L 23   Calcium 8.6 - 10.2 mg/dL 9.9  No data to display             Latest Ref Rng & Units 07/16/2023    2:14 PM  CBC  WBC 3.4 - 10.8 x10E3/uL 7.1   Hemoglobin 13.0 - 17.7 g/dL 40.9   Hematocrit 81.1 - 51.0 % 44.6   Platelets 150 - 450 x10E3/uL 231    Lab Results  Component Value Date   MCV 89 07/16/2023   No results found for: "TSH" No results found for: "HGBA1C"   BNP No results found for: "BNP"  ProBNP No results found for: "PROBNP"   Lipid Panel  No results found for: "CHOL", "TRIG", "HDL", "CHOLHDL", "VLDL", "LDLCALC", "LDLDIRECT", "LABVLDL"   RADIOLOGY: LONG TERM MONITOR (3-14 DAYS)  Result Date: 07/12/2023 Patch Wear Time:  12 days and 13 hours (2024-09-26T15:52:54-0400 to 2024-10-09T05:43:52-0400) Patient had a min HR of 38 bpm, max HR of 141 bpm, and avg HR of 53 bpm. Predominant underlying rhythm was Sinus Rhythm. Slight P wave morphology changes were noted. 4 Supraventricular Tachycardia runs occurred, the run with the fastest interval lasting 8 beats with a max rate of 141 bpm, the longest lasting 8 beats with an avg rate of 105 bpm. Isolated SVEs were rare (<1.0%), SVE Couplets were rare (<1.0%), and SVE Triplets were rare (<1.0%). Isolated VEs were rare (<1.0%), VE Couplets were rare (<1.0%), and no VE Triplets were present.   The predominant rhythm was sinus rhythm with an average rate of 53 bpm.  The slowest sinus rhythm occurred at 3:56 AM on October 9 and was 38 bpm with a maximum sinus rhythm at 102 bpm which occurred at 10:34 AM on September 28.  There were 4 brief episodes of SVT with the fastest 8 beats with a maximum rate at 141 and the longest also 8 beats with average rate at 105.  There were rare isolated  premature atrial and ventricular beats.  There were no episodes of atrial fibrillation or prolonged pauses.   ECHOCARDIOGRAM COMPLETE  Result Date: 06/17/2023    ECHOCARDIOGRAM REPORT   Patient Name:   Arthur Hardy Date of Exam: 06/17/2023 Medical Rec #:  [id number]         Height:       70.0 in Accession #:    [id number]        Weight:       212.0 lb Date of Birth:  Nov 15, 1950          BSA:          2.140 m Patient Age:    72 years          BP:           169/86 mmHg Patient Gender: M                 HR:           55 bpm. Exam Location:  [address] Procedure: 2D Echo, Cardiac Doppler, Color Doppler and Intracardiac            Opacification Agent Indications:    R07.9 Chest Pain  History:        Patient has prior history of Echocardiogram examinations, most                 recent 03/10/2017. Risk Factors:Hypertension, Family History of                 Coronary Artery Disease, Sleep Apnea and HLD, CKD.  Sonographer:    Clearence Ped  RCS Referring Phys: [id number] Glyn Gerads A Taryn Shellhammer IMPRESSIONS  1. Left ventricular ejection fraction, by estimation, is 60 to 65%. The left ventricle has normal function. The left ventricle has no regional wall motion abnormalities. There is mild left ventricular hypertrophy. Left ventricular diastolic parameters are consistent with Grade II diastolic dysfunction (pseudonormalization).  2. Right ventricular systolic function is normal. The right ventricular size is normal. There is normal pulmonary artery systolic pressure. The estimated right ventricular systolic pressure is 27.0 mmHg.  3. Left atrial size was mildly dilated.  4. The mitral valve is grossly normal. Trivial mitral valve regurgitation. No evidence of mitral stenosis.  5. The aortic valve is grossly normal. There is mild thickening of the aortic valve. Aortic valve regurgitation is not visualized. No aortic stenosis is present.  6. Aortic dilatation noted. There is borderline dilatation of the aortic root, measuring 41 mm.  There is borderline dilatation of the ascending aorta, measuring 40 mm.  7. The inferior vena cava is normal in size with greater than 50% respiratory variability, suggesting right atrial pressure of 3 mmHg. FINDINGS  Left Ventricle: Left ventricular ejection fraction, by estimation, is 60 to 65%. The left ventricle has normal function. The left ventricle has no regional wall motion abnormalities. Definity contrast agent was given IV to delineate the left ventricular  endocardial borders. The left ventricular internal cavity size was normal in size. There is mild left ventricular hypertrophy. Left ventricular diastolic parameters are consistent with Grade II diastolic dysfunction (pseudonormalization). Right Ventricle: The right ventricular size is normal. No increase in right ventricular wall thickness. Right ventricular systolic function is normal. There is normal pulmonary artery systolic pressure. The tricuspid regurgitant velocity is 2.45 m/s, and  with an assumed right atrial pressure of 3 mmHg, the estimated right ventricular systolic pressure is 27.0 mmHg. Left Atrium: Left atrial size was mildly dilated. Right Atrium: Right atrial size was normal in size. Pericardium: Trivial pericardial effusion is present. Mitral Valve: The mitral valve is grossly normal. Mild mitral annular calcification. Trivial mitral valve regurgitation. No evidence of mitral valve stenosis. Tricuspid Valve: The tricuspid valve is normal in structure. Tricuspid valve regurgitation is trivial. No evidence of tricuspid stenosis. Aortic Valve: The aortic valve is grossly normal. There is mild thickening of the aortic valve. Aortic valve regurgitation is not visualized. No aortic stenosis is present. Pulmonic Valve: The pulmonic valve was normal in structure. Pulmonic valve regurgitation is trivial. No evidence of pulmonic stenosis. Aorta: Aortic dilatation noted. There is borderline dilatation of the aortic root, measuring 41 mm. There  is borderline dilatation of the ascending aorta, measuring 40 mm. Venous: The inferior vena cava is normal in size with greater than 50% respiratory variability, suggesting right atrial pressure of 3 mmHg. IAS/Shunts: The interatrial septum was not well visualized.  LEFT VENTRICLE PLAX 2D LVIDd:         4.70 cm   Diastology LVIDs:         3.10 cm   LV e' medial:    6.74 cm/s LV PW:         1.00 cm   LV E/e' medial:  14.5 LV IVS:        1.00 cm   LV e' lateral:   10.60 cm/s LVOT diam:     1.90 cm   LV E/e' lateral: 9.2 LV SV:         89 LV SV Index:   42 LVOT Area:     2.84 cm  RIGHT VENTRICLE  RV Basal diam:  3.40 cm RV S prime:     11.90 cm/s TAPSE (M-mode): 2.1 cm RVSP:           27.0 mmHg LEFT ATRIUM             Index        RIGHT ATRIUM           Index LA diam:        3.90 cm 1.82 cm/m   RA Pressure: 3.00 mmHg LA Vol (A2C):   63.5 ml 29.68 ml/m  RA Area:     12.20 cm LA Vol (A4C):   52.9 ml 24.72 ml/m  RA Volume:   30.90 ml  14.44 ml/m LA Biplane Vol: 57.6 ml 26.92 ml/m  AORTIC VALVE LVOT Vmax:   128.00 cm/s LVOT Vmean:  83.300 cm/s LVOT VTI:    0.315 m  AORTA Ao Root diam: 4.10 cm Ao Asc diam:  4.00 cm MITRAL VALVE               TRICUSPID VALVE MV Area (PHT):             TR Peak grad:   24.0 mmHg MV Decel Time:             TR Vmax:        245.00 cm/s MV E velocity: 98.00 cm/s  Estimated RAP:  3.00 mmHg MV A velocity: 76.50 cm/s  RVSP:           27.0 mmHg MV E/A ratio:  1.28                            SHUNTS                            Systemic VTI:  0.32 m                            Systemic Diam: 1.90 cm Weston Brass MD Electronically signed by Weston Brass MD Signature Date/Time: 06/17/2023/10:03:18 AM    Final      Additional studies/ records that were reviewed today include:  I have reviewed the prior evaluation from Dr. Eden Emms in 2018.  Records of Dr. Clelia Croft at [place] were reviewed.    ASSESSMENT:    1. Chest heaviness   2. Coronary calcification   3. Tachycardia-bradycardia syndrome  (HCC)   4. Evaluate for OSA (obstructive sleep apnea)   5. Hyperlipidemia with target low density lipoprotein (LDL) cholesterol less than 55 mg/dL   6. Snoring   7. Incomplete right bundle branch block   8. History of tobacco use     PLAN:  Arthur Hardy is a 72 year old gentleman who has a remote tobacco history, and previously had undergone cardiac catheterization in 2007 and was found to have normal coronary arteries and LV function.  An echo Doppler study in 2018 showed normal LV function with EF 60 to 65% without wall motion abnormalities.  There was mild calcification of his aortic valve annulus.  He has remained fairly active with walking.  In mid July, he had experienced 4 days of lightheadedness with mild headache and fatigability.  His blood pressure was low and at that time it was recommended he discontinue hydrochlorothiazide which he had been on for hypertension leg swelling.  He has felt improved with discontinuation of therapy.  Recently,  he has continued to experience significant fatigability and has experienced recurrent episodes of chest pressure.  I reviewed his cardiac CTA which showed a calcium score of 117 representing 38th percentile.  However he had moderate total plaque volume with more prominent noncalcified plaque than calcified plaque.  There was 25 to 49% left main stenosis, less than 25% mid LAD stenosis and 25 to 49% diagonal 1 stenosis.  I also reviewed his Zio patch monitor which showed predominant sinus rhythm but with the slowest heart rate at 38 bpm and average heart rate at 53 bpm.  He did have 4 brief episodes of SVT with maximum rate at 141.  Initially, with his ongoing chest pain I did discuss further evaluation for potential ischemic etiology and reviewed possible nuclear medicine PET stressing versus definitive cardiac catheterization.  With his continued recurrent chest sensation and heaviness after prolonged discussion, both he and his daughter prefer  definitive evaluation and as result I will schedule him to undergo definitive cardiac catheterization with potential need for intervention if indicated.  I am also concerned that he may very well have obstructive sleep apnea contributing to his significant fatigability.  He does snore and remotely he had transiently used CPAP therapy but stopped many years ago.  He will undergo fasting laboratory in anticipation for his cardiac catheterization.  Most recent lipid studies from August 2024 showed LDL cholesterol at 76.  With his CAD I am adding Zetia 10 mg to his simvastatin 40 mg and will also check an LP(a) with target LDL less than 55.  I have reviewed the risks, indications, and alternatives to cardiac catheterization, possible angioplasty, and stenting with the patient. Risks include but are not limited to bleeding, infection, vascular injury, stroke, myocardial infection, arrhythmia, kidney injury, radiation-related injury in the case of prolonged fluoroscopy use, emergency cardiac surgery, and death. The patient understands the risks of serious complication is 1-2 in 1000 with diagnostic cardiac cath and 1-2% or less with angioplasty/stenting.  Plan cardiac catheterization on Tuesday, July 21, 2023.  Time spent: 50 minutes  Medication Adjustments/Labs and Tests Ordered: Current medicines are reviewed at length with the patient today.  Concerns regarding medicines are outlined above.  Medication changes, Labs and Tests ordered today are listed in the Patient Instructions below. Patient Instructions  Medication Instructions:  Start taking Zetia 10 mg daily Start metoprolol tartrate 12.5 mg prn  *If you need a refill on your cardiac medications before your next appointment, please call your pharmacy*   Lab Work: BMET,CBC,PT,LPa If you have labs (blood work) drawn today and your tests are completely normal, you will receive your results only by: MyChart Message (if you have MyChart) OR A paper  copy in the mail If you have any lab test that is abnormal or we need to change your treatment, we will call you to review the results.   Testing/Procedures: Schedule sleep study at [facility]  Your physician has recommended that you have a sleep study. This test records several body functions during sleep, including: brain activity, eye movement, oxygen and carbon dioxide blood levels, heart rate and rhythm, breathing rate and rhythm, the flow of air through your mouth and nose, snoring, body muscle movements, and chest and belly movement.     Cardiac Cath to be scheduled at [facility] hospital Tuesday 07-21-23 we will call back with a time  [facility] OF [place]. [facility] AT [facility] [address] [place] [address] Dept: [phone] Loc: [phone]  ALECZANDER MAHON  07/16/2023  You are scheduled for a Cardiac Catheterization on Tuesday, November 12 with   1. Please arrive at the [facility] (Main Entrance A) at [facility]: [address] [place], [place] [address] at  (This time is 2 hour(s) before your procedure to ensure your preparation). Free valet parking service is available. You will check in at ADMITTING. The support person will be asked to wait in the waiting room.  It is OK to have someone drop you off and come back when you are ready to be discharged.    Special note: Every effort is made to have your procedure done on time. Please understand that emergencies sometimes delay scheduled procedures.  2. Diet: Do not eat solid foods after midnight.  The patient may have clear liquids until 5am upon the day of the procedure.  3. Labs: You will need to have blood drawn on 07/16/23  4. Medication instructions in preparation for your procedure:   Contrast Allergy: No      On the morning of your procedure, take your Aspirin 81 mg and any morning medicines NOT listed above.  You may  use sips of water.  5. Plan to go home the same day, you will only stay overnight if medically necessary. 6. Bring a current list of your medications and current insurance cards. 7. You MUST have a responsible person to drive you home. 8. Someone MUST be with you the first 24 hours after you arrive home or your discharge will be delayed. 9. Please wear clothes that are easy to get on and off and wear slip-on shoes.  Thank you for allowing [place] to care for you!   -- [facility]      Follow-Up: At [facility], you and your health needs are our priority.  As part of our continuing mission to provide you with exceptional heart care, we have created designated Provider Care Teams.  These Care Teams include your primary Cardiologist (physician) and Advanced Practice Providers (APPs -  Physician Assistants and Nurse Practitioners) who all work together to provide you with the care you need, when you need it.  We recommend signing up for the patient portal called "MyChart".  Sign up information is provided on this After Visit Summary.  MyChart is used to connect with patients for Virtual Visits (Telemedicine).  Patients are able to view lab/test results, encounter notes, upcoming appointments, etc.  Non-urgent messages can be sent to your provider as well.   To learn more about what you can do with MyChart, go to [email or web address].    Your next appointment:  Thursday 11.21.24 at 1:55 pm with Bernadene Person        Signed, Nicki Guadalajara, MD  07/17/2023 10:57 AM    [facility] [address], [address], [place], [place]  [address] Phone: [phone]

## 2023-07-16 NOTE — H&P (View-Only) (Signed)
Cardiology Office Note    Date:  07/17/2023   ID:  Arthur Hardy, DOB 1950/09/21, MRN [id number]  PCP:  Lupita Raider, MD  Cardiologist:  Nicki Guadalajara, MD   6 week F/U cardiology evaluation referred through the courtesy of Dr. Cam Hai for evaluation of symptomatic bradycardia and episodic vague chest heaviness.   History of Present Illness:  Arthur Hardy is a 72 y.o. male who is followed by Dr. Cam Hai at [facility] for primary care.  The 2018, he had seen Dr. Charlton Haws for cardiology evaluation.  He has a history of hypertension, family history for CAD, hyperlipidemia, and prior tobacco use.  Remotely in 2007 he had a nuclear perfusion study which showed small inferoseptal defect most likely diaphragmatic attenuation.  He had undergone cardiac catheterization by Dr. Mayford Knife which demonstrated normal coronary arteries with normal EF and pressures.  Remotely he had transiently used CPAP therapy for obstructive sleep apnea but stopped therapy years ago.  Recently, he was evaluated by Dr. Cam Hai.  He has had issues with orthostatic hypotension and bradycardia which seem to occur fairly suddenly on March 26, 2023.  He had had weight loss from dietary adjustment and at that time had seen Dr. Judithann Sheen who recommended he stop taking hydrochlorothiazide.  He has felt better with the discontinuance of hydrochlorothiazide.  He had seen Dr. Clelia Croft in follow-up and oftentimes heart rates have been in the 40s to 50s at home with blood pressures in the 100-140/30-50s.  He has been wearing oxygen at night.  He has a remote history of prostate cancer treated by Dr. Vernie Ammons and intermittent cigar use.  He had undergone laboratory on April 27, 2023 which showed stable hemoglobin at 14.4 with hematocrit 42.9.  TSH was 3.06.  Total cholesterol was 150 with LDL cholesterol 76 triglycerides 97 HDL 56.  BUN is 14 creatinine 1.24 and glucose 96.  A recent chest x-ray on May 12, 2023 did not  show any active cardiopulmonary disease.  He has recently experiences some chest heaviness and at times has a nervous feeling.  He admits to increased fatigue and his episodes of lightheadedness have improved.  He is scheduled to undergo CT of his abdomen and pelvis due to complaints which he voiced to Dr. Clelia Croft.  With his recent episodes of symptomatic bradycardia he was now referred for cardiology evaluation.  During his May 29, 2023 initial evaluation, I recommended he undergo a 6-year follow-up echo Doppler study for reassessment of LV systolic and diastolic function.  With his chest pressure I suggested he undergo coronary CTA for assessment of coronary calcification and percent luminal stenosis.  I also recommended he wear a 14-day Zio patch monitor.  He had already been scheduled to undergo CT of his abdomen and pelvis by Dr. Clelia Croft.  Coronary CTA on June 04, 2023 showed no significant extracardiac findings with mild dependent atelectasis of both lungs.  Calcium score was 117 representing 38th percentile.  Total plaque volume was moderate with calcified plaque at 17mm3 and noncalcified plaque 172 mm3.  A 2D echo Doppler study on June 17, 2023 showed normal LV function with EF 60 to 65% with grade 2 diastolic dysfunction.  There was mild LVH without regional wall motion abnormalities.  There was mild aortic sclerosis without stenosis.  There was borderline dilatation of aortic root at 41 mm and ascending aorta at 40 mm.  He wore a Zio patch monitor which showed an average heart rate  at 53 with minimal heart rate at 38 and maximum heart rate at 141.  Predominant rhythm was sinus with slight P wave morphology changes being noted.  He had 4 brief episodes of SVT with the fastest interval lasting 8 beats at a rate of 141 there were rare isolated PACs couplets and triplets with rare isolated PVCs and couplets.  Presently, he has continued to experience some episodes of chest discomfort.  He also  admits to significant fatigability particularly in the afternoon.  He typically goes to bed around 1030 and wakes up at 5:30 AM.  His resting pulse typically is in the 50s but he does experience episodes of transient increased palpitations.  He had undergone laboratory in August 2024 which showed total cholesterol 150, HDL 56, LDL 76, triglycerides 97.  He presents to the office today with his daughter who is a Engineer, civil (consulting) and works at [employer] and is an Scientist, research (medical).   Past Medical History:  Diagnosis Date   Benign hypertension with CKD (chronic kidney disease) stage I    Chronic fatigue syndrome with fibromyalgia    SEEN BY [facility] (DR Dareen Piano)   CKD (chronic kidney disease) stage 3, GFR 30-59 ml/min (HCC)    Hiatal hernia    Hyperlipidemia    Insomnia    Obesity    Prostate cancer (HCC) 2004   DR. OTTELIN   Sickle cell trait (HCC)    Sleep apnea    WEARS CPAP   Tobacco abuse     No past surgical history on file.  Current Medications: Outpatient Medications Prior to Visit  Medication Sig Dispense Refill   acetaminophen (TYLENOL) 500 MG tablet Take 500-1,000 mg by mouth every 6 (six) hours as needed for mild pain (pain score 1-3).     aspirin 81 MG chewable tablet Chew 81 mg by mouth daily.     omeprazole (PRILOSEC OTC) 20 MG tablet Take 20 mg by mouth daily.     simvastatin (ZOCOR) 40 MG tablet Take 40 mg by mouth daily.     metoprolol tartrate (LOPRESSOR) 25 MG tablet Take 1 tablet (25 mg total) by mouth once for 1 dose. Take one tablet 2 hours before the CT procedure 1 tablet 0   No facility-administered medications prior to visit.     Allergies:   Patient has no known allergies.   Social History   Socioeconomic History   Marital status: Married    Spouse name: Not on file   Number of children: 3   Years of education: Not on file   Highest education level: Not on file  Occupational History   Occupation: RETIRED  Tobacco Use   Smoking status: Some Days     Current packs/day: 0.00    Types: Cigarettes, Cigars    Last attempt to quit: 02/11/2016    Years since quitting: 7.4   Smokeless tobacco: Never  Vaping Use   Vaping status: Never Used  Substance and Sexual Activity   Alcohol use: No   Drug use: No   Sexual activity: Not on file  Other Topics Concern   Not on file  Social History Narrative   Not on file   Social Determinants of Health   Financial Resource Strain: Not on file  Food Insecurity: Not on file  Transportation Needs: Not on file  Physical Activity: Not on file  Stress: Not on file  Social Connections: Not on file     Socially he is divorced and currently single.  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  He previously smoked cigarettes and quit in 2007 teen.  He does smoke intermittent cigars.  He does not drink alcohol.  He denies any drug use.  He remains active with walking.  Family History: Both parents are deceased.  Mother died at age 68 and had suffered a myocardial infarction.  Father died at age 26 and had heart issues.  He has 1 living brother age 50.  He has 4 daughters ages 66, 61, 78 and mid 52s.  ROS General: Negative; No fevers, chills, or night sweats;  HEENT: Negative; No changes in vision or hearing, sinus congestion, difficulty swallowing Pulmonary: Negative; No cough, wheezing, shortness of breath, hemoptysis Cardiovascular: See HPI, recent bradycardia, mild lightheadedness, nonexertional chest pressure GI: Negative; No nausea, vomiting, diarrhea, or abdominal pain GU: Negative; No dysuria, hematuria, or difficulty voiding Musculoskeletal: Negative; no myalgias, joint pain, or weakness Hematologic/Oncology: Negative; no easy bruising, bleeding Endocrine: Negative; no heat/cold intolerance; no diabetes Neuro: Negative; no changes in balance, headaches Skin: Negative; No rashes or skin lesions Psychiatric: Negative; No behavioral problems, depression Sleep: Significant  fatigability particularly in the afternoon.  Positive snoring; no bruxism, restless legs, hypnogognic hallucinations, no cataplexy Other comprehensive 14 point system review is negative.   PHYSICAL EXAM:   VS:  BP 110/70   Pulse (!) 55   Ht 5\' 10"  (1.778 m)   Wt 218 lb (98.9 kg)   SpO2 98%   BMI 31.28 kg/m     Repeat blood pressure by me was 136/76   Wt Readings from Last 3 Encounters:  07/16/23 218 lb (98.9 kg)  05/29/23 212 lb (96.2 kg)  04/30/17 218 lb (98.9 kg)    General: Alert, oriented, no distress.  Bearded Skin: normal turgor, no rashes, warm and dry HEENT: Normocephalic, atraumatic. Pupils equal round and reactive to light; sclera anicteric; extraocular muscles intact; Nose without nasal septal hypertrophy 3 Mouth/Parynx benign; Mallinpatti scale Neck: No JVD, no carotid bruits; normal carotid upstroke Lungs: clear to ausculatation and percussion; no wheezing or rales Chest wall: without tenderness to palpitation Heart: PMI not displaced, RRR, s1 s2 normal, 1/6 systolic murmur, no diastolic murmur, no rubs, gallops, thrills, or heaves Abdomen: soft, nontender; no hepatosplenomehaly, BS+; abdominal aorta nontender and not dilated by palpation. Back: no CVA tenderness Pulses 2+ Musculoskeletal: full range of motion, normal strength, no joint deformities Extremities: no clubbing cyanosis or edema, Homan's sign negative  Neurologic: grossly nonfocal; Cranial nerves grossly wnl Psychologic: Normal mood and affect   Studies/Labs Reviewed:   EKG Interpretation Date/Time:  Thursday July 16 2023 12:11:28 EST Ventricular Rate:  55 PR Interval:  140 QRS Duration:  100 QT Interval:  438 QTC Calculation: 419 R Axis:   21  Text Interpretation: Sinus bradycardia When compared with ECG of 29-May-2023 15:00, No significant change was found Confirmed by Nicki Guadalajara ([id number]) on 07/16/2023 1:03:50 PM    Recent Labs:    Latest Ref Rng & Units 07/16/2023    2:14 PM  BMP   Glucose 70 - 99 mg/dL 86   BUN 8 - 27 mg/dL 10   Creatinine 6.57 - 1.27 mg/dL 8.46   BUN/Creat Ratio 10 - 24 9   Sodium 134 - 144 mmol/L 141   Potassium 3.5 - 5.2 mmol/L 4.3   Chloride 96 - 106 mmol/L 104   CO2 20 - 29 mmol/L 23   Calcium 8.6 - 10.2 mg/dL 9.9  No data to display             Latest Ref Rng & Units 07/16/2023    2:14 PM  CBC  WBC 3.4 - 10.8 x10E3/uL 7.1   Hemoglobin 13.0 - 17.7 g/dL 40.9   Hematocrit 81.1 - 51.0 % 44.6   Platelets 150 - 450 x10E3/uL 231    Lab Results  Component Value Date   MCV 89 07/16/2023   No results found for: "TSH" No results found for: "HGBA1C"   BNP No results found for: "BNP"  ProBNP No results found for: "PROBNP"   Lipid Panel  No results found for: "CHOL", "TRIG", "HDL", "CHOLHDL", "VLDL", "LDLCALC", "LDLDIRECT", "LABVLDL"   RADIOLOGY: LONG TERM MONITOR (3-14 DAYS)  Result Date: 07/12/2023 Patch Wear Time:  12 days and 13 hours (2024-09-26T15:52:54-0400 to 2024-10-09T05:43:52-0400) Patient had a min HR of 38 bpm, max HR of 141 bpm, and avg HR of 53 bpm. Predominant underlying rhythm was Sinus Rhythm. Slight P wave morphology changes were noted. 4 Supraventricular Tachycardia runs occurred, the run with the fastest interval lasting 8 beats with a max rate of 141 bpm, the longest lasting 8 beats with an avg rate of 105 bpm. Isolated SVEs were rare (<1.0%), SVE Couplets were rare (<1.0%), and SVE Triplets were rare (<1.0%). Isolated VEs were rare (<1.0%), VE Couplets were rare (<1.0%), and no VE Triplets were present.   The predominant rhythm was sinus rhythm with an average rate of 53 bpm.  The slowest sinus rhythm occurred at 3:56 AM on October 9 and was 38 bpm with a maximum sinus rhythm at 102 bpm which occurred at 10:34 AM on September 28.  There were 4 brief episodes of SVT with the fastest 8 beats with a maximum rate at 141 and the longest also 8 beats with average rate at 105.  There were rare isolated  premature atrial and ventricular beats.  There were no episodes of atrial fibrillation or prolonged pauses.   ECHOCARDIOGRAM COMPLETE  Result Date: 06/17/2023    ECHOCARDIOGRAM REPORT   Patient Name:   Arthur Hardy Date of Exam: 06/17/2023 Medical Rec #:  [id number]         Height:       70.0 in Accession #:    [id number]        Weight:       212.0 lb Date of Birth:  Nov 15, 1950          BSA:          2.140 m Patient Age:    72 years          BP:           169/86 mmHg Patient Gender: M                 HR:           55 bpm. Exam Location:  [address] Procedure: 2D Echo, Cardiac Doppler, Color Doppler and Intracardiac            Opacification Agent Indications:    R07.9 Chest Pain  History:        Patient has prior history of Echocardiogram examinations, most                 recent 03/10/2017. Risk Factors:Hypertension, Family History of                 Coronary Artery Disease, Sleep Apnea and HLD, CKD.  Sonographer:    Clearence Ped  RCS Referring Phys: [id number] Glyn Gerads A Taryn Shellhammer IMPRESSIONS  1. Left ventricular ejection fraction, by estimation, is 60 to 65%. The left ventricle has normal function. The left ventricle has no regional wall motion abnormalities. There is mild left ventricular hypertrophy. Left ventricular diastolic parameters are consistent with Grade II diastolic dysfunction (pseudonormalization).  2. Right ventricular systolic function is normal. The right ventricular size is normal. There is normal pulmonary artery systolic pressure. The estimated right ventricular systolic pressure is 27.0 mmHg.  3. Left atrial size was mildly dilated.  4. The mitral valve is grossly normal. Trivial mitral valve regurgitation. No evidence of mitral stenosis.  5. The aortic valve is grossly normal. There is mild thickening of the aortic valve. Aortic valve regurgitation is not visualized. No aortic stenosis is present.  6. Aortic dilatation noted. There is borderline dilatation of the aortic root, measuring 41 mm.  There is borderline dilatation of the ascending aorta, measuring 40 mm.  7. The inferior vena cava is normal in size with greater than 50% respiratory variability, suggesting right atrial pressure of 3 mmHg. FINDINGS  Left Ventricle: Left ventricular ejection fraction, by estimation, is 60 to 65%. The left ventricle has normal function. The left ventricle has no regional wall motion abnormalities. Definity contrast agent was given IV to delineate the left ventricular  endocardial borders. The left ventricular internal cavity size was normal in size. There is mild left ventricular hypertrophy. Left ventricular diastolic parameters are consistent with Grade II diastolic dysfunction (pseudonormalization). Right Ventricle: The right ventricular size is normal. No increase in right ventricular wall thickness. Right ventricular systolic function is normal. There is normal pulmonary artery systolic pressure. The tricuspid regurgitant velocity is 2.45 m/s, and  with an assumed right atrial pressure of 3 mmHg, the estimated right ventricular systolic pressure is 27.0 mmHg. Left Atrium: Left atrial size was mildly dilated. Right Atrium: Right atrial size was normal in size. Pericardium: Trivial pericardial effusion is present. Mitral Valve: The mitral valve is grossly normal. Mild mitral annular calcification. Trivial mitral valve regurgitation. No evidence of mitral valve stenosis. Tricuspid Valve: The tricuspid valve is normal in structure. Tricuspid valve regurgitation is trivial. No evidence of tricuspid stenosis. Aortic Valve: The aortic valve is grossly normal. There is mild thickening of the aortic valve. Aortic valve regurgitation is not visualized. No aortic stenosis is present. Pulmonic Valve: The pulmonic valve was normal in structure. Pulmonic valve regurgitation is trivial. No evidence of pulmonic stenosis. Aorta: Aortic dilatation noted. There is borderline dilatation of the aortic root, measuring 41 mm. There  is borderline dilatation of the ascending aorta, measuring 40 mm. Venous: The inferior vena cava is normal in size with greater than 50% respiratory variability, suggesting right atrial pressure of 3 mmHg. IAS/Shunts: The interatrial septum was not well visualized.  LEFT VENTRICLE PLAX 2D LVIDd:         4.70 cm   Diastology LVIDs:         3.10 cm   LV e' medial:    6.74 cm/s LV PW:         1.00 cm   LV E/e' medial:  14.5 LV IVS:        1.00 cm   LV e' lateral:   10.60 cm/s LVOT diam:     1.90 cm   LV E/e' lateral: 9.2 LV SV:         89 LV SV Index:   42 LVOT Area:     2.84 cm  RIGHT VENTRICLE  RV Basal diam:  3.40 cm RV S prime:     11.90 cm/s TAPSE (M-mode): 2.1 cm RVSP:           27.0 mmHg LEFT ATRIUM             Index        RIGHT ATRIUM           Index LA diam:        3.90 cm 1.82 cm/m   RA Pressure: 3.00 mmHg LA Vol (A2C):   63.5 ml 29.68 ml/m  RA Area:     12.20 cm LA Vol (A4C):   52.9 ml 24.72 ml/m  RA Volume:   30.90 ml  14.44 ml/m LA Biplane Vol: 57.6 ml 26.92 ml/m  AORTIC VALVE LVOT Vmax:   128.00 cm/s LVOT Vmean:  83.300 cm/s LVOT VTI:    0.315 m  AORTA Ao Root diam: 4.10 cm Ao Asc diam:  4.00 cm MITRAL VALVE               TRICUSPID VALVE MV Area (PHT):             TR Peak grad:   24.0 mmHg MV Decel Time:             TR Vmax:        245.00 cm/s MV E velocity: 98.00 cm/s  Estimated RAP:  3.00 mmHg MV A velocity: 76.50 cm/s  RVSP:           27.0 mmHg MV E/A ratio:  1.28                            SHUNTS                            Systemic VTI:  0.32 m                            Systemic Diam: 1.90 cm Weston Brass MD Electronically signed by Weston Brass MD Signature Date/Time: 06/17/2023/10:03:18 AM    Final      Additional studies/ records that were reviewed today include:  I have reviewed the prior evaluation from Dr. Eden Emms in 2018.  Records of Dr. Clelia Croft at [place] were reviewed.    ASSESSMENT:    1. Chest heaviness   2. Coronary calcification   3. Tachycardia-bradycardia syndrome  (HCC)   4. Evaluate for OSA (obstructive sleep apnea)   5. Hyperlipidemia with target low density lipoprotein (LDL) cholesterol less than 55 mg/dL   6. Snoring   7. Incomplete right bundle branch block   8. History of tobacco use     PLAN:  Arthur Hardy is a 72 year old gentleman who has a remote tobacco history, and previously had undergone cardiac catheterization in 2007 and was found to have normal coronary arteries and LV function.  An echo Doppler study in 2018 showed normal LV function with EF 60 to 65% without wall motion abnormalities.  There was mild calcification of his aortic valve annulus.  He has remained fairly active with walking.  In mid July, he had experienced 4 days of lightheadedness with mild headache and fatigability.  His blood pressure was low and at that time it was recommended he discontinue hydrochlorothiazide which he had been on for hypertension leg swelling.  He has felt improved with discontinuation of therapy.  Recently,  he has continued to experience significant fatigability and has experienced recurrent episodes of chest pressure.  I reviewed his cardiac CTA which showed a calcium score of 117 representing 38th percentile.  However he had moderate total plaque volume with more prominent noncalcified plaque than calcified plaque.  There was 25 to 49% left main stenosis, less than 25% mid LAD stenosis and 25 to 49% diagonal 1 stenosis.  I also reviewed his Zio patch monitor which showed predominant sinus rhythm but with the slowest heart rate at 38 bpm and average heart rate at 53 bpm.  He did have 4 brief episodes of SVT with maximum rate at 141.  Initially, with his ongoing chest pain I did discuss further evaluation for potential ischemic etiology and reviewed possible nuclear medicine PET stressing versus definitive cardiac catheterization.  With his continued recurrent chest sensation and heaviness after prolonged discussion, both he and his daughter prefer  definitive evaluation and as result I will schedule him to undergo definitive cardiac catheterization with potential need for intervention if indicated.  I am also concerned that he may very well have obstructive sleep apnea contributing to his significant fatigability.  He does snore and remotely he had transiently used CPAP therapy but stopped many years ago.  He will undergo fasting laboratory in anticipation for his cardiac catheterization.  Most recent lipid studies from August 2024 showed LDL cholesterol at 76.  With his CAD I am adding Zetia 10 mg to his simvastatin 40 mg and will also check an LP(a) with target LDL less than 55.  I have reviewed the risks, indications, and alternatives to cardiac catheterization, possible angioplasty, and stenting with the patient. Risks include but are not limited to bleeding, infection, vascular injury, stroke, myocardial infection, arrhythmia, kidney injury, radiation-related injury in the case of prolonged fluoroscopy use, emergency cardiac surgery, and death. The patient understands the risks of serious complication is 1-2 in 1000 with diagnostic cardiac cath and 1-2% or less with angioplasty/stenting.  Plan cardiac catheterization on Tuesday, July 21, 2023.  Time spent: 50 minutes  Medication Adjustments/Labs and Tests Ordered: Current medicines are reviewed at length with the patient today.  Concerns regarding medicines are outlined above.  Medication changes, Labs and Tests ordered today are listed in the Patient Instructions below. Patient Instructions  Medication Instructions:  Start taking Zetia 10 mg daily Start metoprolol tartrate 12.5 mg prn  *If you need a refill on your cardiac medications before your next appointment, please call your pharmacy*   Lab Work: BMET,CBC,PT,LPa If you have labs (blood work) drawn today and your tests are completely normal, you will receive your results only by: MyChart Message (if you have MyChart) OR A paper  copy in the mail If you have any lab test that is abnormal or we need to change your treatment, we will call you to review the results.   Testing/Procedures: Schedule sleep study at [facility]  Your physician has recommended that you have a sleep study. This test records several body functions during sleep, including: brain activity, eye movement, oxygen and carbon dioxide blood levels, heart rate and rhythm, breathing rate and rhythm, the flow of air through your mouth and nose, snoring, body muscle movements, and chest and belly movement.     Cardiac Cath to be scheduled at [facility] hospital Tuesday 07-21-23 we will call back with a time  [facility] OF [place]. [facility] AT [facility] [address] [place] [address] Dept: [phone] Loc: [phone]  ALECZANDER MAHON  07/16/2023  You are scheduled for a Cardiac Catheterization on Tuesday, November 12 with   1. Please arrive at the [facility] (Main Entrance A) at [facility]: [address] [place], [place] [address] at  (This time is 2 hour(s) before your procedure to ensure your preparation). Free valet parking service is available. You will check in at ADMITTING. The support person will be asked to wait in the waiting room.  It is OK to have someone drop you off and come back when you are ready to be discharged.    Special note: Every effort is made to have your procedure done on time. Please understand that emergencies sometimes delay scheduled procedures.  2. Diet: Do not eat solid foods after midnight.  The patient may have clear liquids until 5am upon the day of the procedure.  3. Labs: You will need to have blood drawn on 07/16/23  4. Medication instructions in preparation for your procedure:   Contrast Allergy: No      On the morning of your procedure, take your Aspirin 81 mg and any morning medicines NOT listed above.  You may  use sips of water.  5. Plan to go home the same day, you will only stay overnight if medically necessary. 6. Bring a current list of your medications and current insurance cards. 7. You MUST have a responsible person to drive you home. 8. Someone MUST be with you the first 24 hours after you arrive home or your discharge will be delayed. 9. Please wear clothes that are easy to get on and off and wear slip-on shoes.  Thank you for allowing [place] to care for you!   -- [facility]      Follow-Up: At [facility], you and your health needs are our priority.  As part of our continuing mission to provide you with exceptional heart care, we have created designated Provider Care Teams.  These Care Teams include your primary Cardiologist (physician) and Advanced Practice Providers (APPs -  Physician Assistants and Nurse Practitioners) who all work together to provide you with the care you need, when you need it.  We recommend signing up for the patient portal called "MyChart".  Sign up information is provided on this After Visit Summary.  MyChart is used to connect with patients for Virtual Visits (Telemedicine).  Patients are able to view lab/test results, encounter notes, upcoming appointments, etc.  Non-urgent messages can be sent to your provider as well.   To learn more about what you can do with MyChart, go to [email or web address].    Your next appointment:  Thursday 11.21.24 at 1:55 pm with Bernadene Person        Signed, Nicki Guadalajara, MD  07/17/2023 10:57 AM    [facility] [address], [address], [place], [place]  [address] Phone: [phone]

## 2023-07-16 NOTE — Patient Instructions (Addendum)
Medication Instructions:  Start taking Zetia 10 mg daily Start metoprolol tartrate 12.5 mg prn  *If you need a refill on your cardiac medications before your next appointment, please call your pharmacy*   Lab Work: BMET,CBC,PT,LPa If you have labs (blood work) drawn today and your tests are completely normal, you will receive your results only by: MyChart Message (if you have MyChart) OR A paper copy in the mail If you have any lab test that is abnormal or we need to change your treatment, we will call you to review the results.   Testing/Procedures: Schedule sleep study at [facility]  Your physician has recommended that you have a sleep study. This test records several body functions during sleep, including: brain activity, eye movement, oxygen and carbon dioxide blood levels, heart rate and rhythm, breathing rate and rhythm, the flow of air through your mouth and nose, snoring, body muscle movements, and chest and belly movement.     Cardiac Cath to be scheduled at [facility] hospital Tuesday 07-21-23 we will call back with a time  [facility] DEPT OF [facility] AT [facility] [address] [place] [address] [place] [address] Dept: [phone] Loc: [phone]  Arthur Hardy  07/16/2023  You are scheduled for a Cardiac Catheterization on Tuesday, November 12 with   1. Please arrive at the [facility] (Main Entrance A) at [facility]: [address] [place], [place] [address] at  (This time is 2 hour(s) before your procedure to ensure your preparation). Free valet parking service is available. You will check in at ADMITTING. The support person will be asked to wait in the waiting room.  It is OK to have someone drop you off and come back when you are ready to be discharged.    Special note: Every effort is made to have your procedure done on time. Please understand that emergencies sometimes delay  scheduled procedures.  2. Diet: Do not eat solid foods after midnight.  The patient may have clear liquids until 5am upon the day of the procedure.  3. Labs: You will need to have blood drawn on 07/16/23  4. Medication instructions in preparation for your procedure:   Contrast Allergy: No      On the morning of your procedure, take your Aspirin 81 mg and any morning medicines NOT listed above.  You may use sips of water.  5. Plan to go home the same day, you will only stay overnight if medically necessary. 6. Bring a current list of your medications and current insurance cards. 7. You MUST have a responsible person to drive you home. 8. Someone MUST be with you the first 24 hours after you arrive home or your discharge will be delayed. 9. Please wear clothes that are easy to get on and off and wear slip-on shoes.  Thank you for allowing [place] to care for you!   -- [facility]      Follow-Up: At [facility], you and your health needs are our priority.  As part of our continuing mission to provide you with exceptional heart care, we have created designated Provider Care Teams.  These Care Teams include your primary Cardiologist (physician) and Advanced Practice Providers (APPs -  Physician Assistants and Nurse Practitioners) who all work together to provide you with the care you need, when you need it.  We recommend signing up for the patient portal called "MyChart".  Sign up information is provided on this After Visit Summary.  MyChart is used to connect with patients for Virtual Visits (Telemedicine).  Patients are able to view lab/test results, encounter notes, upcoming appointments, etc.  Non-urgent messages can be sent to your provider as well.   To learn more about what you can do with MyChart, go to [email or web address].    Your next appointment:  Thursday 11.21.24 at 1:55 pm with Bernadene Person

## 2023-07-17 ENCOUNTER — Other Ambulatory Visit: Payer: Self-pay | Admitting: *Deleted

## 2023-07-17 ENCOUNTER — Encounter: Payer: Self-pay | Admitting: Cardiovascular Disease

## 2023-07-17 DIAGNOSIS — R079 Chest pain, unspecified: Secondary | ICD-10-CM

## 2023-07-17 LAB — BASIC METABOLIC PANEL
BUN/Creatinine Ratio: 9 — ABNORMAL LOW (ref 10–24)
BUN: 10 mg/dL (ref 8–27)
CO2: 23 mmol/L (ref 20–29)
Calcium: 9.9 mg/dL (ref 8.6–10.2)
Chloride: 104 mmol/L (ref 96–106)
Creatinine, Ser: 1.14 mg/dL (ref 0.76–1.27)
Glucose: 86 mg/dL (ref 70–99)
Potassium: 4.3 mmol/L (ref 3.5–5.2)
Sodium: 141 mmol/L (ref 134–144)
eGFR: 67 mL/min/{1.73_m2} (ref >59)

## 2023-07-17 LAB — LIPOPROTEIN A (LPA): Lipoprotein (a): 28.1 nmol/L (ref <75.0)

## 2023-07-17 LAB — CBC
Hematocrit: 44.6 % (ref 37.5–51.0)
Hemoglobin: 14.7 g/dL (ref 13.0–17.7)
MCH: 29.5 pg (ref 26.6–33.0)
MCHC: 33 g/dL (ref 31.5–35.7)
MCV: 89 fL (ref 79–97)
Platelets: 231 10*3/uL (ref 150–450)
RBC: 4.99 x10E6/uL (ref 4.14–5.80)
RDW: 12.6 % (ref 11.6–15.4)
WBC: 7.1 10*3/uL (ref 3.4–10.8)

## 2023-07-17 LAB — PT AND PTT
INR: 1 (ref 0.9–1.2)
Prothrombin Time: 11.3 s (ref 9.1–12.0)
aPTT: 27 s (ref 24–33)

## 2023-07-17 NOTE — Progress Notes (Signed)
Left heart cath order placed

## 2023-07-20 ENCOUNTER — Telehealth: Payer: Self-pay | Admitting: *Deleted

## 2023-07-20 NOTE — Telephone Encounter (Signed)
Cardiac Catheterization scheduled at [facility] for: Tuesday July 21, 2023 7:30 AM Arrival time [facility] Main Entrance A at:  Nothing to eat after midnight prior to procedure, clear liquids until 5 AM day of procedure.  Medication instructions: -Usual morning medications can be taken with sips of water including aspirin 81 mg.  Plan to go home the same day, you will only stay overnight if medically necessary.  You must have responsible adult to drive you home.  Someone must be with you the first 24 hours after you arrive home.  Reviewed procedure instructions with patient.

## 2023-07-21 ENCOUNTER — Ambulatory Visit (HOSPITAL_COMMUNITY)
Admission: RE | Admit: 2023-07-21 | Discharge: 2023-07-21 | Disposition: A | Discharge status: Home / Self Care | Payer: PPO | Attending: Internal Medicine | Admitting: Internal Medicine

## 2023-07-21 ENCOUNTER — Encounter (HOSPITAL_COMMUNITY): Payer: Self-pay | Admitting: Internal Medicine

## 2023-07-21 ENCOUNTER — Other Ambulatory Visit: Payer: Self-pay | Admitting: Cardiology

## 2023-07-21 ENCOUNTER — Other Ambulatory Visit: Payer: Self-pay

## 2023-07-21 ENCOUNTER — Encounter (HOSPITAL_COMMUNITY)
Admission: RE | Disposition: A | Discharge status: Home / Self Care | Payer: Self-pay | Source: Home / Self Care | Attending: Internal Medicine

## 2023-07-21 DIAGNOSIS — I129 Hypertensive chronic kidney disease with stage 1 through stage 4 chronic kidney disease, or unspecified chronic kidney disease: Secondary | ICD-10-CM | POA: Diagnosis not present

## 2023-07-21 DIAGNOSIS — I2510 Atherosclerotic heart disease of native coronary artery without angina pectoris: Secondary | ICD-10-CM | POA: Diagnosis not present

## 2023-07-21 DIAGNOSIS — Z79899 Other long term (current) drug therapy: Secondary | ICD-10-CM | POA: Diagnosis not present

## 2023-07-21 DIAGNOSIS — F17290 Nicotine dependence, other tobacco product, uncomplicated: Secondary | ICD-10-CM | POA: Insufficient documentation

## 2023-07-21 DIAGNOSIS — I495 Sick sinus syndrome: Secondary | ICD-10-CM | POA: Diagnosis not present

## 2023-07-21 DIAGNOSIS — I4510 Unspecified right bundle-branch block: Secondary | ICD-10-CM | POA: Diagnosis not present

## 2023-07-21 DIAGNOSIS — Z8249 Family history of ischemic heart disease and other diseases of the circulatory system: Secondary | ICD-10-CM | POA: Insufficient documentation

## 2023-07-21 DIAGNOSIS — E785 Hyperlipidemia, unspecified: Secondary | ICD-10-CM | POA: Diagnosis not present

## 2023-07-21 DIAGNOSIS — N183 Chronic kidney disease, stage 3 unspecified: Secondary | ICD-10-CM | POA: Insufficient documentation

## 2023-07-21 DIAGNOSIS — Z8546 Personal history of malignant neoplasm of prostate: Secondary | ICD-10-CM | POA: Insufficient documentation

## 2023-07-21 DIAGNOSIS — R079 Chest pain, unspecified: Secondary | ICD-10-CM

## 2023-07-21 HISTORY — PX: LEFT HEART CATH AND CORONARY ANGIOGRAPHY: CATH118249

## 2023-07-21 HISTORY — PX: CORONARY PRESSURE/FFR STUDY: CATH118243

## 2023-07-21 LAB — POCT ACTIVATED CLOTTING TIME: Activated Clotting Time: 262 s

## 2023-07-21 SURGERY — LEFT HEART CATH AND CORONARY ANGIOGRAPHY
Anesthesia: LOCAL

## 2023-07-21 MED ORDER — FUROSEMIDE 20 MG PO TABS: 20.0000 mg | ORAL_TABLET | Freq: Two times a day (BID) | ORAL | 11 refills | Status: AC

## 2023-07-21 MED ORDER — LIDOCAINE HCL (PF) 1 % IJ SOLN
INTRAMUSCULAR | Status: DC | PRN
  Administered 2023-07-21: 2 mL

## 2023-07-21 MED ORDER — LIDOCAINE HCL (PF) 1 % IJ SOLN
INTRAMUSCULAR | Status: AC
  Filled 2023-07-21: qty 30

## 2023-07-21 MED ORDER — SODIUM CHLORIDE 0.9% FLUSH: 3.0000 mL | Freq: Two times a day (BID) | INTRAVENOUS | Status: DC

## 2023-07-21 MED ORDER — SODIUM CHLORIDE 0.9 % WEIGHT BASED INFUSION
3.0000 mL/kg/h | INTRAVENOUS | Status: DC
  Administered 2023-07-21: 3 mL/kg/h via INTRAVENOUS

## 2023-07-21 MED ORDER — FENTANYL CITRATE (PF) 100 MCG/2ML IJ SOLN
INTRAMUSCULAR | Status: DC | PRN
  Administered 2023-07-21: 25 ug via INTRAVENOUS

## 2023-07-21 MED ORDER — VERAPAMIL HCL 2.5 MG/ML IV SOLN
INTRAVENOUS | Status: DC | PRN
  Administered 2023-07-21: 10 mL via INTRA_ARTERIAL

## 2023-07-21 MED ORDER — HEPARIN SODIUM (PORCINE) 1000 UNIT/ML IJ SOLN
INTRAMUSCULAR | Status: AC
  Filled 2023-07-21: qty 10

## 2023-07-21 MED ORDER — FENTANYL CITRATE (PF) 100 MCG/2ML IJ SOLN
INTRAMUSCULAR | Status: AC
  Filled 2023-07-21: qty 2

## 2023-07-21 MED ORDER — ONDANSETRON HCL 4 MG/2ML IJ SOLN: 4.0000 mg | Freq: Four times a day (QID) | INTRAMUSCULAR | Status: DC | PRN

## 2023-07-21 MED ORDER — SODIUM CHLORIDE 0.9% FLUSH: 3.0000 mL | INTRAVENOUS | Status: DC | PRN

## 2023-07-21 MED ORDER — ACETAMINOPHEN 325 MG PO TABS: 650.0000 mg | ORAL_TABLET | ORAL | Status: DC | PRN

## 2023-07-21 MED ORDER — FUROSEMIDE 10 MG/ML IJ SOLN
INTRAMUSCULAR | Status: AC
Start: 2023-07-21 — End: ?
  Filled 2023-07-21: qty 4

## 2023-07-21 MED ORDER — MIDAZOLAM HCL 2 MG/2ML IJ SOLN
INTRAMUSCULAR | Status: AC
  Filled 2023-07-21: qty 2

## 2023-07-21 MED ORDER — HEPARIN SODIUM (PORCINE) 1000 UNIT/ML IJ SOLN
INTRAMUSCULAR | Status: DC | PRN
  Administered 2023-07-21 (×2): 5000 [IU] via INTRAVENOUS

## 2023-07-21 MED ORDER — LABETALOL HCL 5 MG/ML IV SOLN: 10.0000 mg | INTRAVENOUS | Status: DC | PRN

## 2023-07-21 MED ORDER — ADENOSINE (DIAGNOSTIC) 140MCG/KG/MIN
INTRAVENOUS | Status: DC | PRN
  Administered 2023-07-21: 140 ug/kg/min via INTRAVENOUS

## 2023-07-21 MED ORDER — IOHEXOL 350 MG/ML SOLN
INTRAVENOUS | Status: DC | PRN
  Administered 2023-07-21: 50 mL

## 2023-07-21 MED ORDER — HYDRALAZINE HCL 20 MG/ML IJ SOLN: 10.0000 mg | INTRAMUSCULAR | Status: DC | PRN

## 2023-07-21 MED ORDER — FUROSEMIDE 10 MG/ML IJ SOLN
INTRAMUSCULAR | Status: DC | PRN
  Administered 2023-07-21: 20 mg via INTRAVENOUS

## 2023-07-21 MED ORDER — SODIUM CHLORIDE 0.9 % IV SOLN: 250.0000 mL | INTRAVENOUS | Status: DC | PRN

## 2023-07-21 MED ORDER — MIDAZOLAM HCL 2 MG/2ML IJ SOLN
INTRAMUSCULAR | Status: DC | PRN
  Administered 2023-07-21: 1 mg via INTRAVENOUS

## 2023-07-21 MED ORDER — SODIUM CHLORIDE 0.9 % WEIGHT BASED INFUSION: 1.0000 mL/kg/h | INTRAVENOUS | Status: DC

## 2023-07-21 MED ORDER — VERAPAMIL HCL 2.5 MG/ML IV SOLN
INTRAVENOUS | Status: AC
  Filled 2023-07-21: qty 2

## 2023-07-21 SURGICAL SUPPLY — 10 items
CATH INFINITI 5FR ANG PIGTAIL (CATHETERS) IMPLANT
CATH INFINITI AMBI 6FR TG (CATHETERS) IMPLANT
CATH LAUNCHER 6FR EBU 3 (CATHETERS) IMPLANT
DEVICE RAD COMP TR BAND LRG (VASCULAR PRODUCTS) IMPLANT
GLIDESHEATH SLEND SS 6F .021 (SHEATH) IMPLANT
GUIDEWIRE PRESSURE X 175 (WIRE) IMPLANT
KIT SYRINGE INJ CVI SPIKEX1 (MISCELLANEOUS) IMPLANT
PACK CARDIAC CATHETERIZATION (CUSTOM PROCEDURE TRAY) ×1 IMPLANT
SET ATX-X65L (MISCELLANEOUS) IMPLANT
WIRE EMERALD 3MM-J .035X260CM (WIRE) IMPLANT

## 2023-07-21 NOTE — Interval H&P Note (Signed)
History and Physical Interval Note:  07/21/2023 8:04 AM  Arthur Hardy  has presented today for surgery, with the diagnosis of cad.  The various methods of treatment have been discussed with the patient and family. After consideration of risks, benefits and other options for treatment, the patient has consented to  Procedure(s): LEFT HEART CATH AND CORONARY ANGIOGRAPHY (N/A) as a surgical intervention.  The patient's history has been reviewed, patient examined, no change in status, stable for surgery.  I have reviewed the patient's chart and labs.  Questions were answered to the patient's satisfaction.     Orbie Pyo

## 2023-07-21 NOTE — Progress Notes (Signed)
Order BMET placed per Dr. Lynnette Caffey. Rx for lasix 20mg  BID sent by MD

## 2023-07-21 NOTE — Discharge Instructions (Addendum)
Start lasix 20mg  twice a day with blood work next week.

## 2023-07-28 NOTE — Progress Notes (Unsigned)
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  Past Medical History    Past Medical History:  Diagnosis Date   Benign hypertension with CKD (chronic kidney disease) stage I    Chronic fatigue syndrome with fibromyalgia    SEEN BY [facility] (DR Dareen Piano)   CKD (chronic kidney disease) stage 3, GFR 30-59 ml/min (HCC)    Hiatal hernia    Hyperlipidemia    Insomnia    Obesity    Prostate cancer (HCC) 2004   DR. OTTELIN   Sickle cell trait (HCC)    Sleep apnea    WEARS CPAP   Tobacco abuse    Past Surgical History:  Procedure Laterality Date   CORONARY PRESSURE/FFR STUDY N/A 07/21/2023   Procedure: CORONARY PRESSURE/FFR STUDY;  Surgeon: Orbie Pyo, MD;  Location: [facility];  Service: Cardiovascular;  Laterality: N/A;   LEFT HEART CATH AND CORONARY ANGIOGRAPHY N/A 07/21/2023   Procedure: LEFT HEART CATH AND CORONARY ANGIOGRAPHY;  Surgeon: Orbie Pyo, MD;  Location: [facility];  Service: Cardiovascular;  Laterality: N/A;    Allergies  No Known Allergies   Labs/Other Studies Reviewed    The following studies were reviewed today:  Cardiac Studies & Procedures   CARDIAC CATHETERIZATION  CARDIAC CATHETERIZATION 07/21/2023  Narrative   Ost Cx to Prox Cx lesion is 15% stenosed.  1.  Minimal nonobstructive coronary artery disease. 2.  No evidence of coronary microvascular dysfunction with CFR of 2.6 and IMR of 4 with normal being greater than 2-2.5 and less than 25 respectively. 3.  Elevated LVEDP of 28 mmHg.  Recommendation: Medical therapy.  Given the  patient's elevated LVEDP will start Lasix 20 mg twice daily and check a BMP in 1 week.  Findings Coronary Findings Diagnostic  Dominance: Left  Left Anterior Descending The vessel exhibits minimal luminal irregularities.  Left Circumflex Ost Cx to Prox Cx lesion is 15% stenosed.  Right Coronary Artery Vessel is small.  Intervention  No interventions have been documented.   STRESS TESTS  MYOCARDIAL PERFUSION IMAGING 03/10/2017  Narrative  Nuclear stress EF: 61%.  No T wave inversion was noted during stress.  There was no ST segment deviation noted during stress.  This is a low risk study.  Normal perfusion. LVEF 61%. This is a low risk study.   ECHOCARDIOGRAM  ECHOCARDIOGRAM COMPLETE 06/17/2023  Narrative ECHOCARDIOGRAM REPORT    Patient Name:   AUDWIN MOLITORIS Date of Exam: 06/17/2023 Medical Rec #:  [id number]         Height:       70.0 in Accession #:    [id number]        Weight:       212.0 lb Date of Birth:  1951-03-09          BSA:          2.140 m Patient Age:    72 years          BP:           169/86 mmHg Patient Gender: M  HR:           55 bpm. Exam Location:  [address]  Procedure: 2D Echo, Cardiac Doppler, Color Doppler and Intracardiac Opacification Agent  Indications:    R07.9 Chest Pain  History:        Patient has prior history of Echocardiogram examinations, most recent 03/10/2017. Risk Factors:Hypertension, Family History of Coronary Artery Disease, Sleep Apnea and HLD, CKD.  Sonographer:    Clearence Ped RCS Referring Phys: [phone] THOMAS A KELLY  IMPRESSIONS   1. Left ventricular ejection fraction, by estimation, is 60 to 65%. The left ventricle has normal function. The left ventricle has no regional wall motion abnormalities. There is mild left ventricular hypertrophy. Left ventricular diastolic parameters are consistent with Grade II diastolic dysfunction (pseudonormalization). 2. Right ventricular systolic function is  normal. The right ventricular size is normal. There is normal pulmonary artery systolic pressure. The estimated right ventricular systolic pressure is 27.0 mmHg. 3. Left atrial size was mildly dilated. 4. The mitral valve is grossly normal. Trivial mitral valve regurgitation. No evidence of mitral stenosis. 5. The aortic valve is grossly normal. There is mild thickening of the aortic valve. Aortic valve regurgitation is not visualized. No aortic stenosis is present. 6. Aortic dilatation noted. There is borderline dilatation of the aortic root, measuring 41 mm. There is borderline dilatation of the ascending aorta, measuring 40 mm. 7. The inferior vena cava is normal in size with greater than 50% respiratory variability, suggesting right atrial pressure of 3 mmHg.  FINDINGS Left Ventricle: Left ventricular ejection fraction, by estimation, is 60 to 65%. The left ventricle has normal function. The left ventricle has no regional wall motion abnormalities. Definity contrast agent was given IV to delineate the left ventricular endocardial borders. The left ventricular internal cavity size was normal in size. There is mild left ventricular hypertrophy. Left ventricular diastolic parameters are consistent with Grade II diastolic dysfunction (pseudonormalization).  Right Ventricle: The right ventricular size is normal. No increase in right ventricular wall thickness. Right ventricular systolic function is normal. There is normal pulmonary artery systolic pressure. The tricuspid regurgitant velocity is 2.45 m/s, and with an assumed right atrial pressure of 3 mmHg, the estimated right ventricular systolic pressure is 27.0 mmHg.  Left Atrium: Left atrial size was mildly dilated.  Right Atrium: Right atrial size was normal in size.  Pericardium: Trivial pericardial effusion is present.  Mitral Valve: The mitral valve is grossly normal. Mild mitral annular calcification. Trivial mitral valve regurgitation. No  evidence of mitral valve stenosis.  Tricuspid Valve: The tricuspid valve is normal in structure. Tricuspid valve regurgitation is trivial. No evidence of tricuspid stenosis.  Aortic Valve: The aortic valve is grossly normal. There is mild thickening of the aortic valve. Aortic valve regurgitation is not visualized. No aortic stenosis is present.  Pulmonic Valve: The pulmonic valve was normal in structure. Pulmonic valve regurgitation is trivial. No evidence of pulmonic stenosis.  Aorta: Aortic dilatation noted. There is borderline dilatation of the aortic root, measuring 41 mm. There is borderline dilatation of the ascending aorta, measuring 40 mm.  Venous: The inferior vena cava is normal in size with greater than 50% respiratory variability, suggesting right atrial pressure of 3 mmHg.  IAS/Shunts: The interatrial septum was not well visualized.   LEFT VENTRICLE PLAX 2D LVIDd:         4.70 cm   Diastology LVIDs:         3.10 cm   LV e' medial:  6.74 cm/s LV PW:         1.00 cm   LV E/e' medial:  14.5 LV IVS:        1.00 cm   LV e' lateral:   10.60 cm/s LVOT diam:     1.90 cm   LV E/e' lateral: 9.2 LV SV:         89 LV SV Index:   42 LVOT Area:     2.84 cm   RIGHT VENTRICLE RV Basal diam:  3.40 cm RV S prime:     11.90 cm/s TAPSE (M-mode): 2.1 cm RVSP:           27.0 mmHg  LEFT ATRIUM             Index        RIGHT ATRIUM           Index LA diam:        3.90 cm 1.82 cm/m   RA Pressure: 3.00 mmHg LA Vol (A2C):   63.5 ml 29.68 ml/m  RA Area:     12.20 cm LA Vol (A4C):   52.9 ml 24.72 ml/m  RA Volume:   30.90 ml  14.44 ml/m LA Biplane Vol: 57.6 ml 26.92 ml/m AORTIC VALVE LVOT Vmax:   128.00 cm/s LVOT Vmean:  83.300 cm/s LVOT VTI:    0.315 m  AORTA Ao Root diam: 4.10 cm Ao Asc diam:  4.00 cm  MITRAL VALVE               TRICUSPID VALVE MV Area (PHT):             TR Peak grad:   24.0 mmHg MV Decel Time:             TR Vmax:        245.00 cm/s MV E velocity: 98.00  cm/s  Estimated RAP:  3.00 mmHg MV A velocity: 76.50 cm/s  RVSP:           27.0 mmHg MV E/A ratio:  1.28 SHUNTS Systemic VTI:  0.32 m Systemic Diam: 1.90 cm  Weston Brass MD Electronically signed by Weston Brass MD Signature Date/Time: 06/17/2023/10:03:18 AM    Final    MONITORS  LONG TERM MONITOR (3-14 DAYS) 06/23/2023  Narrative Patch Wear Time:  12 days and 13 hours (2024-09-26T15:52:54-0400 to 2024-10-09T05:43:52-0400)  Patient had a min HR of 38 bpm, max HR of 141 bpm, and avg HR of 53 bpm. Predominant underlying rhythm was Sinus Rhythm. Slight P wave morphology changes were noted. 4 Supraventricular Tachycardia runs occurred, the run with the fastest interval lasting 8 beats with a max rate of 141 bpm, the longest lasting 8 beats with an avg rate of 105 bpm. Isolated SVEs were rare (<1.0%), SVE Couplets were rare (<1.0%), and SVE Triplets were rare (<1.0%). Isolated VEs were rare (<1.0%), VE Couplets were rare (<1.0%), and no VE Triplets were present.  The predominant rhythm was sinus rhythm with an average rate of 53 bpm.  The slowest sinus rhythm occurred at 3:56 AM on October 9 and was 38 bpm with a maximum sinus rhythm at 102 bpm which occurred at 10:34 AM on September 28.  There were 4 brief episodes of SVT with the fastest 8 beats with a maximum rate at 141 and the longest also 8 beats with average rate at 105.  There were rare isolated premature atrial and ventricular beats.  There were no episodes of atrial fibrillation or prolonged pauses.  CT SCANS  CT CORONARY MORPH W/CTA COR W/SCORE 06/04/2023  Addendum 06/15/2023 12:21 PM ADDENDUM REPORT: 06/15/2023 12:18  EXAM: OVER-READ INTERPRETATION  CT CHEST  The following report is a limited chest CT over-read performed by radiologist Dr. Irma Newness [facility] on 06/15/2023. This over-read does not include interpretation of cardiac or coronary anatomy or pathology. The coronary CTA  interpretation by the cardiologist is attached.  COMPARISON:  Chest CT 04/08/2006.  FINDINGS: Mediastinum/Nodes: No enlarged lymph nodes within the visualized mediastinum.Small mediastinal lymph nodes are unchanged.  Lungs/Pleura: No pleural effusion or pneumothorax. Mild dependent atelectasis at both lung bases.  Upper abdomen: No significant findings in the visualized upper abdomen.  Musculoskeletal/Chest wall: No chest wall mass or suspicious osseous findings within the visualized chest. Mild thoracic spondylosis.  IMPRESSION: 1. No significant extracardiac findings in the visualized lower chest. 2. Mild dependent atelectasis at both lung bases.   Electronically Signed By: Carey Bullocks M.D. On: 06/15/2023 12:18  Narrative CLINICAL DATA:  Chest pain  EXAM: Cardiac/Coronary CTA  TECHNIQUE: A non-contrast, gated CT scan was obtained with axial slices of 3 mm through the heart for calcium scoring. Calcium scoring was performed using the Agatston method. A 120 kV prospective, gated, contrast cardiac scan was obtained. Gantry rotation speed was 250 msecs and collimation was 0.6 mm. Two sublingual nitroglycerin tablets (0.8 mg) were given. The 3D data set was reconstructed in 5% intervals of the 35-75% of the R-R cycle. Diastolic phases were analyzed on a dedicated workstation using MPR, MIP, and VRT modes. The patient received 95 cc of contrast.  FINDINGS: Image quality: Excellent.  Noise artifact is: Limited.  Coronary Arteries:  Normal coronary origin.  Left dominance.  Left main: The left main is a large caliber vessel with a normal take off from the left coronary cusp that trifurcates to form a left anterior descending artery, Ramus and a left circumflex artery. There is mild calcified plaque in the LM with associated stenosis of 25-49%.  Left anterior descending artery: The LAD is patent and gives off 2 patent diagonal branches. There is minimal  calcified plaque in the mid LAD with associated stenosis of < 25% stenosis. There is mild calcified plaque in the D1 with associated stenosis of 25-49%.  Ramus: The Ramus is patent with no plaque or stenosis.  Left circumflex artery: The LCX is dominant and patent with no evidence of plaque or stenosis. The LCX gives off 2 patent obtuse marginal branches, LPDA and LPLA. There is no plaque or stenosis.  Right coronary artery: The RCA is non-dominant with normal take off from the right coronary cusp. There is no evidence of plaque or stenosis.  Right Atrium: Right atrial size is within normal limits.  Right Ventricle: The right ventricular cavity is within normal limits.  Left Atrium: Left atrial size is normal in size with no left atrial appendage filling defect.  Left Ventricle: The ventricular cavity size is within normal limits.  Pulmonary arteries: Normal in size.  Pulmonary veins: Normal pulmonary venous drainage.  Pericardium: Normal thickness without significant effusion or calcium present.  Cardiac valves: The aortic valve is trileaflet without significant calcification. The mitral valve is normal without significant calcification.  Aorta: Normal caliber without significant disease.  Extra-cardiac findings: See attached radiology report for non-cardiac structures.  IMPRESSION: 1. Coronary calcium score of 117. This was 38th percentile for age-, sex, and race-matched controls.  2. Total plaque volume 205 mm3 which is 16th percentile for age- and sex-matched  controls (calcified plaque 23mm3; non-calcified plaque 17mm3). TPV is moderate.  3. Normal coronary origin with left dominance.  4. Mild atherosclerosis. 25-49% LM, <25% mid LAD, 25-49% D1. CAD RADS 2.  5. Consider non atherosclerotic causes of chest pain.  6. Recommend preventive therapy and risk factor modification.  RECOMMENDATIONS: 1. CAD-RADS 0: No evidence of CAD (0%). Consider  non-atherosclerotic causes of chest pain.  2. CAD-RADS 1: Minimal non-obstructive CAD (0-24%). Consider non-atherosclerotic causes of chest pain. Consider preventive therapy and risk factor modification.  3. CAD-RADS 2: Mild non-obstructive CAD (25-49%). Consider non-atherosclerotic causes of chest pain. Consider preventive therapy and risk factor modification.  4. CAD-RADS 3: Moderate stenosis. Consider symptom-guided anti-ischemic pharmacotherapy as well as risk factor modification per guideline directed care. Additional analysis with CT FFR will be submitted.  5. CAD-RADS 4: Severe stenosis. (70-99% or > 50% left main). Cardiac catheterization or CT FFR is recommended. Consider symptom-guided anti-ischemic pharmacotherapy as well as risk factor modification per guideline directed care. Invasive coronary angiography recommended with revascularization per published guideline statements.  6. CAD-RADS 5: Total coronary occlusion (100%). Consider cardiac catheterization or viability assessment. Consider symptom-guided anti-ischemic pharmacotherapy as well as risk factor modification per guideline directed care.  7. CAD-RADS N: Non-diagnostic study. Obstructive CAD can't be excluded. Alternative evaluation is recommended.  Armanda Magic, MD  Electronically Signed: By: Armanda Magic M.D. 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  Cardiac catheterization in 2017 showed normal coronary arteries and normal LV  function.  Echocardiogram in 2018 showed normal LV function, EF 60%, no RWMA. Coronary CTA in 05/2023 revealed coronary calcium score 117 (30th percentile), moderate total plaque volume, more prominent noncalcified plaques and calcified plaque.  Echocardiogram in 06/2023 showed EF 60 to 65%, normal LV function, no RWMA, LVH, G2 DD, normal RV systolic function, normal PASP, no significant valve abnormalities, mild dilation of the aortic root measuring 41 mm, borderline dilation of the ascending aorta measuring 40 mm.  ZIO monitor in 06/2023 revealed predominantly sinus rhythm, 4 brief episodes of SVT slowest heart rate 30 bpm, average heart rate 53 bpm. Repeat cardiac catheterization on 07/21/2023 revealed minimal nonobstructive CAD, 15% ostial to proximal LCx stenosis, elevated LVEDP. He was started on Lasix 20 mg daily.   He presents today for follow-up accompanied by his daughter.  Since his last visit he has been stable from a cardiac standpoint.  He continues to note intermittent lightheadedness, fatigue, constant chest discomfort which he describes as a slight pressure. He has noticed no improvement in his symptoms with the addition of Lasix. He denies any dyspnea, edema, PND, orthopnea, weight gain. He is asking about possible testing for ATTR amyloidosis. He notes he has follow-up scheduled to discuss possible inspire implant.  He is concerned about his ongoing symptoms of lightheadedness, fatigue, and chest discomfort.  Home Medications    Current Outpatient Medications  Medication Sig Dispense Refill   acetaminophen (TYLENOL) 500 MG tablet Take 500-1,000 mg by mouth every 6 (six) hours as needed for mild pain (pain score 1-3).     aspirin 81 MG chewable  tablet Chew 81 mg by mouth daily.     furosemide (LASIX) 20 MG tablet Take 1 tablet (20 mg total) by mouth 2 (two) times daily. 60 tablet 11   MAGNESIUM PO Take 1 tablet by mouth 3 (three) times a week.     omeprazole (PRILOSEC OTC) 20 MG tablet  Take 20 mg by mouth daily.     simvastatin (ZOCOR) 40 MG tablet Take 40 mg by mouth daily.     TURMERIC PO Take 1 tablet by mouth 3 (three) times a week.     VITAMIN D PO Take 1 capsule by mouth 3 (three) times a week.     ezetimibe (ZETIA) 10 MG tablet Take 1 tablet (10 mg total) by mouth daily. (Patient not taking: Reported on 07/30/2023) 90 tablet 3   metoprolol tartrate (LOPRESSOR) 25 MG tablet Take 25 mg daily if needed for heart palpitations (Patient not taking: Reported on 07/30/2023) 90 tablet 3   No current facility-administered medications for this visit.     Review of Systems    He denies dyspnea, pnd, orthopnea, n, v, syncope, edema, weight gain, or early satiety. All other systems reviewed and are otherwise negative except as noted above.   Physical Exam    VS:  BP (!) 144/82   Pulse (!) 52   Ht 5\' 10"  (1.778 m)   Wt 210 lb (95.3 kg)   SpO2 97%   BMI 30.13 kg/m  GEN: Well nourished, well developed, in no acute distress. HEENT: normal. Neck: Supple, no JVD, carotid bruits, or masses. Cardiac: RRR, no murmurs, rubs, or gallops. No clubbing, cyanosis, edema.  Radials/DP/PT 2+ and equal bilaterally.  Respiratory:  Respirations regular and unlabored, clear to auscultation bilaterally. GI: Soft, nontender, nondistended, BS + x 4. MS: no deformity or atrophy. Skin: warm and dry, no rash. Neuro:  Strength and sensation are intact. Psych: Normal affect.  Accessory Clinical Findings    ECG personally reviewed by me today - EKG Interpretation Date/Time:  Thursday July 30 2023 13:46:51 EST Ventricular Rate:  52 PR Interval:  138 QRS Duration:  100 QT Interval:  440 QTC Calculation: 409 R Axis:   18  Text Interpretation: Sinus bradycardia When compared with ECG of 21-Jul-2023 09:33, No significant change was found Confirmed by Bernadene Person ([id number]) on 07/30/2023 2:15:03 PM  - no acute changes.   Lab Results  Component Value Date   WBC 7.1 07/16/2023   HGB 14.7  07/16/2023   HCT 44.6 07/16/2023   MCV 89 07/16/2023   PLT 231 07/16/2023   Lab Results  Component Value Date   CREATININE 1.14 07/16/2023   BUN 10 07/16/2023   NA 141 07/16/2023   K 4.3 07/16/2023   CL 104 07/16/2023   CO2 23 07/16/2023   No results found for: "ALT", "AST", "GGT", "ALKPHOS", "BILITOT" No results found for: "CHOL", "HDL", "LDLCALC", "LDLDIRECT", "TRIG", "CHOLHDL"  No results found for: "HGBA1C"  Assessment & Plan    1. CAD/Elevated LVEDP: Coronary CTA in 05/2023 revealed coronary calcium score 117 (30th percentile), moderate total plaque volume, more prominent noncalcified plaques and calcified plaque. Cardiac catheterization on 07/21/2023 revealed minimal nonobstructive CAD, 15% ostial to proximal LCx stenosis, elevated LVEDP.  He continues to note intermittent lightheadedness, fatigue, constant chest discomfort which he describes as a slight pressure. He has noticed no improvement in his symptoms with the addition of Lasix. Echo in 06/2023 showed EF 60 to 65%, normal LV function, no RWMA, LVH, G2 DD, normal RV  systolic function, normal PASP, no significant valve abnormalities, mild dilation of the aortic root measuring 41 mm, borderline dilation of the ascending aorta measuring 40 mm.  He is asking about testing for possible ATTR amyloidosis.  Reviewed with Dr. Tresa Endo, who agrees that no further testing is indicated at this time given reassuring echo. Continue monitor symptoms.  Will update BMET today. Continue aspirin, Lasix, simvastatin, Zetia as below.  2. Tachycardia-bradycardia syndrome: Cardiac monitor in 06/2023 revealed predominantly sinus rhythm, 4 brief episodes of SVT slowest heart rate 30 bpm, average heart rate 53 bpm.  He notes intermittent lightheadedness, fatigue, denies any dizziness, presyncope or syncope.  Continue to monitor HR, symptoms.  He drinks a pot of coffee every morning in addition to caffeinated tea, Coca-Cola, and more coffee at nighttime.   Recommend he decrease his caffeine intake to see if his symptoms improve.  Continue metoprolol 25 mg daily as needed for palpitations.  3. Aortic root/ascending aortic dilation: Noted on most recent echocardiogram.  BP well-controlled.  Consider follow-up CT chest/aorta for routine monitoring in 06/2024.  4. Hypertension: BP well controlled. Continue current antihypertensive regimen.   5. Hyperlipidemia: LDL was 76 in 04/2023.  Given ongoing symptoms of fatigue, lightheadedness, chest discomfort despite reassuring cardiac workup to date, will trial statin holiday.  If symptoms improve he will notify us.  If his symptoms do not improve with statin holiday, advised him to resume simvastatin and Zetia.  6. OSA: Previously did not tolerate CPAP.  He he has an appointment to discuss possible inspire implant.  7. Tobacco use: He continues to smoke cigars.  Full cessation advised.  8. Disposition: Follow-up in 10/2023 with Dr. Tresa Endo.    Joylene Grapes, NP 07/30/2023, 4:59 PM

## 2023-07-30 ENCOUNTER — Encounter: Payer: Self-pay | Admitting: Nurse Practitioner

## 2023-07-30 ENCOUNTER — Ambulatory Visit: Payer: PPO | Attending: Nurse Practitioner | Admitting: Nurse Practitioner

## 2023-07-30 VITALS — BP 144/82 | HR 52 | Ht 70.0 in | Wt 210.0 lb

## 2023-07-30 DIAGNOSIS — G4733 Obstructive sleep apnea (adult) (pediatric): Secondary | ICD-10-CM | POA: Diagnosis not present

## 2023-07-30 DIAGNOSIS — I77810 Thoracic aortic ectasia: Secondary | ICD-10-CM

## 2023-07-30 DIAGNOSIS — Z87891 Personal history of nicotine dependence: Secondary | ICD-10-CM

## 2023-07-30 DIAGNOSIS — I2510 Atherosclerotic heart disease of native coronary artery without angina pectoris: Secondary | ICD-10-CM

## 2023-07-30 DIAGNOSIS — I495 Sick sinus syndrome: Secondary | ICD-10-CM | POA: Diagnosis not present

## 2023-07-30 DIAGNOSIS — I10 Essential (primary) hypertension: Secondary | ICD-10-CM

## 2023-07-30 DIAGNOSIS — E785 Hyperlipidemia, unspecified: Secondary | ICD-10-CM | POA: Diagnosis not present

## 2023-07-30 NOTE — Patient Instructions (Signed)
Medication Instructions:  Hold Simvastatin 40 mg daily for 2 weeks.  *If you need a refill on your cardiac medications before your next appointment, please call your pharmacy*   Lab Work: BMET today  Testing/Procedures: NONE ordered at this time of appointment     Follow-Up: At [facility], you and your health needs are our priority.  As part of our continuing mission to provide you with exceptional heart care, we have created designated Provider Care Teams.  These Care Teams include your primary Cardiologist (physician) and Advanced Practice Providers (APPs -  Physician Assistants and Nurse Practitioners) who all work together to provide you with the care you need, when you need it.  We recommend signing up for the patient portal called "MyChart".  Sign up information is provided on this After Visit Summary.  MyChart is used to connect with patients for Virtual Visits (Telemedicine).  Patients are able to view lab/test results, encounter notes, upcoming appointments, etc.  Non-urgent messages can be sent to your provider as well.   To learn more about what you can do with MyChart, go to [email or web address].    Your next appointment:    February 2025   Provider:   Nicki Guadalajara, MD     Other Instructions Please reduce your caffeine intake.

## 2023-07-31 LAB — BASIC METABOLIC PANEL
BUN/Creatinine Ratio: 10 (ref 10–24)
BUN: 13 mg/dL (ref 8–27)
CO2: 25 mmol/L (ref 20–29)
Calcium: 10 mg/dL (ref 8.6–10.2)
Chloride: 101 mmol/L (ref 96–106)
Creatinine, Ser: 1.25 mg/dL (ref 0.76–1.27)
Glucose: 84 mg/dL (ref 70–99)
Potassium: 4.5 mmol/L (ref 3.5–5.2)
Sodium: 140 mmol/L (ref 134–144)
eGFR: 60 mL/min/{1.73_m2} (ref >59)

## 2023-08-03 ENCOUNTER — Telehealth: Payer: Self-pay

## 2023-08-03 NOTE — Telephone Encounter (Signed)
Spoke with pt. Pt was notified of lab results. Pt will continue current medication and f/u as planned.

## 2023-08-07 DIAGNOSIS — G4733 Obstructive sleep apnea (adult) (pediatric): Secondary | ICD-10-CM | POA: Diagnosis not present

## 2023-11-03 ENCOUNTER — Ambulatory Visit: Payer: PPO | Admitting: Cardiovascular Disease

## 2023-11-23 DIAGNOSIS — Z0000 Encounter for general adult medical examination without abnormal findings: Secondary | ICD-10-CM | POA: Diagnosis not present

## 2023-11-23 DIAGNOSIS — I129 Hypertensive chronic kidney disease with stage 1 through stage 4 chronic kidney disease, or unspecified chronic kidney disease: Secondary | ICD-10-CM | POA: Diagnosis not present

## 2023-11-23 DIAGNOSIS — I2510 Atherosclerotic heart disease of native coronary artery without angina pectoris: Secondary | ICD-10-CM | POA: Diagnosis not present

## 2023-11-23 DIAGNOSIS — N183 Chronic kidney disease, stage 3 unspecified: Secondary | ICD-10-CM | POA: Diagnosis not present

## 2023-11-23 DIAGNOSIS — R5382 Chronic fatigue, unspecified: Secondary | ICD-10-CM | POA: Diagnosis not present

## 2023-11-23 DIAGNOSIS — Z8546 Personal history of malignant neoplasm of prostate: Secondary | ICD-10-CM | POA: Diagnosis not present

## 2023-11-23 DIAGNOSIS — M545 Low back pain, unspecified: Secondary | ICD-10-CM | POA: Diagnosis not present

## 2023-11-23 DIAGNOSIS — E669 Obesity, unspecified: Secondary | ICD-10-CM | POA: Diagnosis not present

## 2023-11-23 DIAGNOSIS — I495 Sick sinus syndrome: Secondary | ICD-10-CM | POA: Diagnosis not present

## 2023-11-23 DIAGNOSIS — I7121 Aneurysm of the ascending aorta, without rupture: Secondary | ICD-10-CM | POA: Diagnosis not present

## 2023-11-23 DIAGNOSIS — Z23 Encounter for immunization: Secondary | ICD-10-CM | POA: Diagnosis not present

## 2023-11-23 DIAGNOSIS — E782 Mixed hyperlipidemia: Secondary | ICD-10-CM | POA: Diagnosis not present

## 2023-11-23 DIAGNOSIS — M1990 Unspecified osteoarthritis, unspecified site: Secondary | ICD-10-CM | POA: Diagnosis not present

## 2023-11-26 ENCOUNTER — Ambulatory Visit: Attending: Nurse Practitioner | Admitting: Nurse Practitioner

## 2023-11-26 ENCOUNTER — Encounter: Payer: Self-pay | Admitting: Nurse Practitioner

## 2023-11-26 VITALS — BP 144/78 | HR 63 | Ht 70.6 in | Wt 225.0 lb

## 2023-11-26 DIAGNOSIS — G4733 Obstructive sleep apnea (adult) (pediatric): Secondary | ICD-10-CM | POA: Diagnosis not present

## 2023-11-26 DIAGNOSIS — E785 Hyperlipidemia, unspecified: Secondary | ICD-10-CM

## 2023-11-26 DIAGNOSIS — I10 Essential (primary) hypertension: Secondary | ICD-10-CM

## 2023-11-26 DIAGNOSIS — I77810 Thoracic aortic ectasia: Secondary | ICD-10-CM

## 2023-11-26 DIAGNOSIS — Z87891 Personal history of nicotine dependence: Secondary | ICD-10-CM | POA: Diagnosis not present

## 2023-11-26 DIAGNOSIS — I2510 Atherosclerotic heart disease of native coronary artery without angina pectoris: Secondary | ICD-10-CM | POA: Diagnosis not present

## 2023-11-26 DIAGNOSIS — I495 Sick sinus syndrome: Secondary | ICD-10-CM

## 2023-11-26 NOTE — Progress Notes (Signed)
 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   Past Medical History    Past Medical History:  Diagnosis Date   Benign hypertension with CKD (chronic kidney disease) stage I    Chronic fatigue syndrome with fibromyalgia    SEEN BY [facility] (DR Dareen Piano)   CKD (chronic kidney disease) stage 3, GFR 30-59 ml/min (HCC)    Hiatal hernia    Hyperlipidemia    Insomnia    Obesity    Prostate cancer (HCC) 2004   DR. OTTELIN   Sickle cell trait (HCC)    Sleep apnea    WEARS CPAP   Tobacco abuse    Past Surgical History:  Procedure Laterality Date   CORONARY PRESSURE/FFR STUDY N/A 07/21/2023   Procedure: CORONARY PRESSURE/FFR STUDY;  Surgeon: Orbie Pyo, MD;  Location: [facility];  Service: Cardiovascular;  Laterality: N/A;   LEFT HEART CATH AND CORONARY ANGIOGRAPHY N/A 07/21/2023   Procedure: LEFT HEART CATH AND CORONARY ANGIOGRAPHY;  Surgeon: Orbie Pyo, MD;  Location: [facility];  Service: Cardiovascular;  Laterality: N/A;    Allergies  No Known Allergies   Labs/Other Studies Reviewed    The following studies were reviewed today:  Cardiac Studies & Procedures   ______________________________________________________________________________________________ CARDIAC CATHETERIZATION  CARDIAC CATHETERIZATION 07/21/2023  Narrative   Ost Cx to Prox Cx lesion is 15% stenosed.  1.  Minimal nonobstructive coronary artery disease. 2.  No evidence of coronary microvascular dysfunction with CFR of 2.6 and IMR of 4 with normal being  greater than 2-2.5 and less than 25 respectively. 3.  Elevated LVEDP of 28 mmHg.  Recommendation: Medical therapy.  Given the patient's elevated LVEDP will start Lasix 20 mg twice daily and check a BMP in 1 week.  Findings Coronary Findings Diagnostic  Dominance: Left  Left Anterior Descending The vessel exhibits minimal luminal irregularities.  Left Circumflex Ost Cx to Prox Cx lesion is 15% stenosed.  Right Coronary Artery Vessel is small.  Intervention  No interventions have been documented.   STRESS TESTS  MYOCARDIAL PERFUSION IMAGING 03/10/2017  Narrative  Nuclear stress EF: 61%.  No T wave inversion was noted during stress.  There was no ST segment deviation noted during stress.  This is a low risk study.  Normal perfusion. LVEF 61%. This is a low risk study.   ECHOCARDIOGRAM  ECHOCARDIOGRAM COMPLETE 06/17/2023  Narrative ECHOCARDIOGRAM REPORT    Patient Name:   Arthur Hardy Date of Exam: 06/17/2023 Medical Rec #:  [id number]         Height:       70.0 in Accession #:    [id number]        Weight:       212.0 lb Date of Birth:  July 24, 1951          BSA:          2.140 m Patient Age:    72 years          BP:           169/86 mmHg Patient Gender: M  HR:           55 bpm. Exam Location:  [address]  Procedure: 2D Echo, Cardiac Doppler, Color Doppler and Intracardiac Opacification Agent  Indications:    R07.9 Chest Pain  History:        Patient has prior history of Echocardiogram examinations, most recent 03/10/2017. Risk Factors:Hypertension, Family History of Coronary Artery Disease, Sleep Apnea and HLD, CKD.  Sonographer:    Clearence Ped RCS Referring Phys: [phone] THOMAS A KELLY  IMPRESSIONS   1. Left ventricular ejection fraction, by estimation, is 60 to 65%. The left ventricle has normal function. The left ventricle has no regional wall motion abnormalities. There is mild left ventricular hypertrophy. Left ventricular diastolic  parameters are consistent with Grade II diastolic dysfunction (pseudonormalization). 2. Right ventricular systolic function is normal. The right ventricular size is normal. There is normal pulmonary artery systolic pressure. The estimated right ventricular systolic pressure is 27.0 mmHg. 3. Left atrial size was mildly dilated. 4. The mitral valve is grossly normal. Trivial mitral valve regurgitation. No evidence of mitral stenosis. 5. The aortic valve is grossly normal. There is mild thickening of the aortic valve. Aortic valve regurgitation is not visualized. No aortic stenosis is present. 6. Aortic dilatation noted. There is borderline dilatation of the aortic root, measuring 41 mm. There is borderline dilatation of the ascending aorta, measuring 40 mm. 7. The inferior vena cava is normal in size with greater than 50% respiratory variability, suggesting right atrial pressure of 3 mmHg.  FINDINGS Left Ventricle: Left ventricular ejection fraction, by estimation, is 60 to 65%. The left ventricle has normal function. The left ventricle has no regional wall motion abnormalities. Definity contrast agent was given IV to delineate the left ventricular endocardial borders. The left ventricular internal cavity size was normal in size. There is mild left ventricular hypertrophy. Left ventricular diastolic parameters are consistent with Grade II diastolic dysfunction (pseudonormalization).  Right Ventricle: The right ventricular size is normal. No increase in right ventricular wall thickness. Right ventricular systolic function is normal. There is normal pulmonary artery systolic pressure. The tricuspid regurgitant velocity is 2.45 m/s, and with an assumed right atrial pressure of 3 mmHg, the estimated right ventricular systolic pressure is 27.0 mmHg.  Left Atrium: Left atrial size was mildly dilated.  Right Atrium: Right atrial size was normal in size.  Pericardium: Trivial pericardial effusion is  present.  Mitral Valve: The mitral valve is grossly normal. Mild mitral annular calcification. Trivial mitral valve regurgitation. No evidence of mitral valve stenosis.  Tricuspid Valve: The tricuspid valve is normal in structure. Tricuspid valve regurgitation is trivial. No evidence of tricuspid stenosis.  Aortic Valve: The aortic valve is grossly normal. There is mild thickening of the aortic valve. Aortic valve regurgitation is not visualized. No aortic stenosis is present.  Pulmonic Valve: The pulmonic valve was normal in structure. Pulmonic valve regurgitation is trivial. No evidence of pulmonic stenosis.  Aorta: Aortic dilatation noted. There is borderline dilatation of the aortic root, measuring 41 mm. There is borderline dilatation of the ascending aorta, measuring 40 mm.  Venous: The inferior vena cava is normal in size with greater than 50% respiratory variability, suggesting right atrial pressure of 3 mmHg.  IAS/Shunts: The interatrial septum was not well visualized.   LEFT VENTRICLE PLAX 2D LVIDd:         4.70 cm   Diastology LVIDs:         3.10 cm   LV e' medial:  6.74 cm/s LV PW:         1.00 cm   LV E/e' medial:  14.5 LV IVS:        1.00 cm   LV e' lateral:   10.60 cm/s LVOT diam:     1.90 cm   LV E/e' lateral: 9.2 LV SV:         89 LV SV Index:   42 LVOT Area:     2.84 cm   RIGHT VENTRICLE RV Basal diam:  3.40 cm RV S prime:     11.90 cm/s TAPSE (M-mode): 2.1 cm RVSP:           27.0 mmHg  LEFT ATRIUM             Index        RIGHT ATRIUM           Index LA diam:        3.90 cm 1.82 cm/m   RA Pressure: 3.00 mmHg LA Vol (A2C):   63.5 ml 29.68 ml/m  RA Area:     12.20 cm LA Vol (A4C):   52.9 ml 24.72 ml/m  RA Volume:   30.90 ml  14.44 ml/m LA Biplane Vol: 57.6 ml 26.92 ml/m AORTIC VALVE LVOT Vmax:   128.00 cm/s LVOT Vmean:  83.300 cm/s LVOT VTI:    0.315 m  AORTA Ao Root diam: 4.10 cm Ao Asc diam:  4.00 cm  MITRAL VALVE               TRICUSPID  VALVE MV Area (PHT):             TR Peak grad:   24.0 mmHg MV Decel Time:             TR Vmax:        245.00 cm/s MV E velocity: 98.00 cm/s  Estimated RAP:  3.00 mmHg MV A velocity: 76.50 cm/s  RVSP:           27.0 mmHg MV E/A ratio:  1.28 SHUNTS Systemic VTI:  0.32 m Systemic Diam: 1.90 cm  Weston Brass MD Electronically signed by Weston Brass MD Signature Date/Time: 06/17/2023/10:03:18 AM    Final    MONITORS  LONG TERM MONITOR (3-14 DAYS) 06/23/2023  Narrative Patch Wear Time:  12 days and 13 hours (2024-09-26T15:52:54-0400 to 2024-10-09T05:43:52-0400)  Patient had a min HR of 38 bpm, max HR of 141 bpm, and avg HR of 53 bpm. Predominant underlying rhythm was Sinus Rhythm. Slight P wave morphology changes were noted. 4 Supraventricular Tachycardia runs occurred, the run with the fastest interval lasting 8 beats with a max rate of 141 bpm, the longest lasting 8 beats with an avg rate of 105 bpm. Isolated SVEs were rare (<1.0%), SVE Couplets were rare (<1.0%), and SVE Triplets were rare (<1.0%). Isolated VEs were rare (<1.0%), VE Couplets were rare (<1.0%), and no VE Triplets were present.  The predominant rhythm was sinus rhythm with an average rate of 53 bpm.  The slowest sinus rhythm occurred at 3:56 AM on October 9 and was 38 bpm with a maximum sinus rhythm at 102 bpm which occurred at 10:34 AM on September 28.  There were 4 brief episodes of SVT with the fastest 8 beats with a maximum rate at 141 and the longest also 8 beats with average rate at 105.  There were rare isolated premature atrial and ventricular beats.  There were no episodes of atrial fibrillation or prolonged pauses.  CT SCANS  CT CORONARY MORPH W/CTA COR W/SCORE 06/04/2023  Addendum 06/15/2023 12:21 PM ADDENDUM REPORT: 06/15/2023 12:18  EXAM: OVER-READ INTERPRETATION  CT CHEST  The following report is a limited chest CT over-read performed by radiologist Dr. Irma Newness [facility]  on 06/15/2023. This over-read does not include interpretation of cardiac or coronary anatomy or pathology. The coronary CTA interpretation by the cardiologist is attached.  COMPARISON:  Chest CT 04/08/2006.  FINDINGS: Mediastinum/Nodes: No enlarged lymph nodes within the visualized mediastinum.Small mediastinal lymph nodes are unchanged.  Lungs/Pleura: No pleural effusion or pneumothorax. Mild dependent atelectasis at both lung bases.  Upper abdomen: No significant findings in the visualized upper abdomen.  Musculoskeletal/Chest wall: No chest wall mass or suspicious osseous findings within the visualized chest. Mild thoracic spondylosis.  IMPRESSION: 1. No significant extracardiac findings in the visualized lower chest. 2. Mild dependent atelectasis at both lung bases.   Electronically Signed By: Carey Bullocks M.D. On: 06/15/2023 12:18  Narrative CLINICAL DATA:  Chest pain  EXAM: Cardiac/Coronary CTA  TECHNIQUE: A non-contrast, gated CT scan was obtained with axial slices of 3 mm through the heart for calcium scoring. Calcium scoring was performed using the Agatston method. A 120 kV prospective, gated, contrast cardiac scan was obtained. Gantry rotation speed was 250 msecs and collimation was 0.6 mm. Two sublingual nitroglycerin tablets (0.8 mg) were given. The 3D data set was reconstructed in 5% intervals of the 35-75% of the R-R cycle. Diastolic phases were analyzed on a dedicated workstation using MPR, MIP, and VRT modes. The patient received 95 cc of contrast.  FINDINGS: Image quality: Excellent.  Noise artifact is: Limited.  Coronary Arteries:  Normal coronary origin.  Left dominance.  Left main: The left main is a large caliber vessel with a normal take off from the left coronary cusp that trifurcates to form a left anterior descending artery, Ramus and a left circumflex artery. There is mild calcified plaque in the LM with associated stenosis  of 25-49%.  Left anterior descending artery: The LAD is patent and gives off 2 patent diagonal branches. There is minimal calcified plaque in the mid LAD with associated stenosis of < 25% stenosis. There is mild calcified plaque in the D1 with associated stenosis of 25-49%.  Ramus: The Ramus is patent with no plaque or stenosis.  Left circumflex artery: The LCX is dominant and patent with no evidence of plaque or stenosis. The LCX gives off 2 patent obtuse marginal branches, LPDA and LPLA. There is no plaque or stenosis.  Right coronary artery: The RCA is non-dominant with normal take off from the right coronary cusp. There is no evidence of plaque or stenosis.  Right Atrium: Right atrial size is within normal limits.  Right Ventricle: The right ventricular cavity is within normal limits.  Left Atrium: Left atrial size is normal in size with no left atrial appendage filling defect.  Left Ventricle: The ventricular cavity size is within normal limits.  Pulmonary arteries: Normal in size.  Pulmonary veins: Normal pulmonary venous drainage.  Pericardium: Normal thickness without significant effusion or calcium present.  Cardiac valves: The aortic valve is trileaflet without significant calcification. The mitral valve is normal without significant calcification.  Aorta: Normal caliber without significant disease.  Extra-cardiac findings: See attached radiology report for non-cardiac structures.  IMPRESSION: 1. Coronary calcium score of 117. This was 38th percentile for age-, sex, and race-matched controls.  2. Total plaque volume 205 mm3 which is 16th percentile for age- and sex-matched  controls (calcified plaque 9mm3; non-calcified plaque 136mm3). TPV is moderate.  3. Normal coronary origin with left dominance.  4. Mild atherosclerosis. 25-49% LM, <25% mid LAD, 25-49% D1. CAD RADS 2.  5. Consider non atherosclerotic causes of chest pain.  6. Recommend  preventive therapy and risk factor modification.  RECOMMENDATIONS: 1. CAD-RADS 0: No evidence of CAD (0%). Consider non-atherosclerotic causes of chest pain.  2. CAD-RADS 1: Minimal non-obstructive CAD (0-24%). Consider non-atherosclerotic causes of chest pain. Consider preventive therapy and risk factor modification.  3. CAD-RADS 2: Mild non-obstructive CAD (25-49%). Consider non-atherosclerotic causes of chest pain. Consider preventive therapy and risk factor modification.  4. CAD-RADS 3: Moderate stenosis. Consider symptom-guided anti-ischemic pharmacotherapy as well as risk factor modification per guideline directed care. Additional analysis with CT FFR will be submitted.  5. CAD-RADS 4: Severe stenosis. (70-99% or > 50% left main). Cardiac catheterization or CT FFR is recommended. Consider symptom-guided anti-ischemic pharmacotherapy as well as risk factor modification per guideline directed care. Invasive coronary angiography recommended with revascularization per published guideline statements.  6. CAD-RADS 5: Total coronary occlusion (100%). Consider cardiac catheterization or viability assessment. Consider symptom-guided anti-ischemic pharmacotherapy as well as risk factor modification per guideline directed care.  7. CAD-RADS N: Non-diagnostic study. Obstructive CAD can't be excluded. Alternative evaluation is recommended.  Armanda Magic, MD  Electronically Signed: By: Armanda Magic M.D. 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   Cardiac catheterization in 2017 showed normal coronary arteries and normal LV function. Echocardiogram in 2018 showed normal LV function, EF 60%, no RWMA. Coronary CTA in 05/2023 revealed coronary calcium score 117 (30th percentile), moderate total plaque volume, more prominent noncalcified plaques and calcified plaque. Echocardiogram in 06/2023 showed EF 60 to 65%, normal LV function, no RWMA, LVH, G2 DD, normal RV systolic function, normal PASP, no significant valve abnormalities, mild dilation of the aortic root measuring 41 mm, borderline dilation of the ascending aorta measuring 40 mm. ZIO monitor in 06/2023 revealed predominantly sinus rhythm, 4 brief episodes of SVT slowest heart rate 30 bpm, average heart rate 53 bpm. Repeat cardiac catheterization on 07/21/2023 revealed minimal nonobstructive CAD, 15% ostial to proximal LCx stenosis, elevated LVEDP. He was started on Lasix 20 mg daily. He was last seen in the office on 07/30/2023 and was stable from a cardiac standpoint.  He noted intermittent lightheadedness, fatigue, constant chest discomfort, no improvement in his symptoms with addition of Lasix. Dr. Tresa Endo, no further testing was recommended at the time.   He presents today for follow-up. Since his last visit he has been stable from a cardiac standpoint.  He notes ongoing fatigue, he denies any palpitations, dizziness, dyspnea, edema, PND, orthopnea, weight gain.  BP has been intermittently elevated. He has not been taking his simvastatin or Zetia.  He saw ENT for consideration of inspire implant, he was told he was not a candidate.  He was told he could see a sleep therapist, however he declined.  Overall, his symptoms have been stable.  Home Medications    Current Outpatient Medications  Medication Sig Dispense  Refill   acetaminophen (TYLENOL) 500  MG tablet Take 500-1,000 mg by mouth every 6 (six) hours as needed for mild pain (pain score 1-3).     aspirin 81 MG chewable tablet Chew 81 mg by mouth daily.     furosemide (LASIX) 20 MG tablet Take 1 tablet (20 mg total) by mouth 2 (two) times daily. (Patient taking differently: Take 20 mg by mouth daily.) 60 tablet 11   MAGNESIUM PO Take 1 tablet by mouth 3 (three) times a week.     metoprolol tartrate (LOPRESSOR) 25 MG tablet Take 25 mg daily if needed for heart palpitations 90 tablet 3   omeprazole (PRILOSEC OTC) 20 MG tablet Take 20 mg by mouth daily.     simvastatin (ZOCOR) 40 MG tablet Take 40 mg by mouth daily.     TURMERIC PO Take 1 tablet by mouth 3 (three) times a week.     VITAMIN D PO Take 1 capsule by mouth 3 (three) times a week.     ezetimibe (ZETIA) 10 MG tablet Take 1 tablet (10 mg total) by mouth daily. (Patient not taking: Reported on 07/30/2023) 90 tablet 3   No current facility-administered medications for this visit.     Review of Systems    He denies chest pain, palpitations, dyspnea, pnd, orthopnea, n, v, dizziness, syncope, edema, weight gain, or early satiety. All other systems reviewed and are otherwise negative except as noted above.   Physical Exam    VS:  BP (!) 144/78   Pulse 63   Ht 5' 10.6" (1.793 m)   Wt 225 lb (102.1 kg)   SpO2 98%   BMI 31.74 kg/m   GEN: Well nourished, well developed, in no acute distress. HEENT: normal. Neck: Supple, no JVD, carotid bruits, or masses. Cardiac: RRR, no murmurs, rubs, or gallops. No clubbing, cyanosis, edema.  Radials/DP/PT 2+ and equal bilaterally.  Respiratory:  Respirations regular and unlabored, clear to auscultation bilaterally. GI: Soft, nontender, nondistended, BS + x 4. MS: no deformity or atrophy. Skin: warm and dry, no rash. Neuro:  Strength and sensation are intact. Psych: Normal affect.  Accessory Clinical Findings    ECG personally reviewed by me today -    - no EKG in office today.   Lab  Results  Component Value Date   WBC 7.1 07/16/2023   HGB 14.7 07/16/2023   HCT 44.6 07/16/2023   MCV 89 07/16/2023   PLT 231 07/16/2023   Lab Results  Component Value Date   CREATININE 1.25 07/30/2023   BUN 13 07/30/2023   NA 140 07/30/2023   K 4.5 07/30/2023   CL 101 07/30/2023   CO2 25 07/30/2023   No results found for: "ALT", "AST", "GGT", "ALKPHOS", "BILITOT" No results found for: "CHOL", "HDL", "LDLCALC", "LDLDIRECT", "TRIG", "CHOLHDL"  No results found for: "HGBA1C"  Assessment & Plan   1. CAD/Elevated LVEDP: Coronary CTA in 05/2023 revealed coronary calcium score 117 (30th percentile), moderate total plaque volume, more prominent noncalcified plaques and calcified plaque. Cardiac catheterization on 07/21/2023 revealed minimal nonobstructive CAD, 15% ostial to proximal LCx stenosis, elevated LVEDP.  Echo in 06/2023 showed EF 60 to 65%, normal LV function, no RWMA, LVH, G2 DD, normal RV systolic function, normal PASP, no significant valve abnormalities, mild dilation of the aortic root measuring 41 mm, borderline dilation of the ascending aorta measuring 40 mm.  He continues to note generalized fatigue. He denies symptoms concerning for angina. Will change Lasix to 20 mg daily.  Continue to monitor symptoms.  Continue aspirin, metoprolol, Lasix. Resume simvastatin and Zetia.    2. Tachycardia-bradycardia syndrome: Cardiac monitor in 06/2023 revealed predominantly sinus rhythm, 4 brief episodes of SVT slowest heart rate 30 bpm, average heart rate 53 bpm.  He notes persistent fatigue, denies any dizziness, presyncope or syncope.  Continue to monitor HR, symptoms.  Should he develop symptomatic bradycardia, or more frequent palpitations, he would likely require EP referral.  Will defer for now.  Continue metoprolol 25 mg daily as needed for palpitations.   3. Aortic root/ascending aortic dilation: Noted on most recent echocardiogram.  BP well-controlled.  Consider follow-up CT chest/aorta  for routine monitoring in 06/2024.   4. Hypertension: BP mildly elevated in office today.. Continue to monitor BP with decrease Lasix as above, report BP consistent greater than 140/80.  If BP remains elevated above goal, consider reintroduction of low-dose hydrochlorothiazide, low-dose ARB versus amlodipineContinue current antihypertensive regimen.    5. Hyperlipidemia: LDL was 76 in 04/2023.  He has had generalized fatigue, symptoms did not improve with statin holiday.  Encouraged him to resume simvastatin and Zetia.  He states he will have repeat labs with his PCP.     6. OSA: Previously did not tolerate CPAP.  He ENT and was told he was not a candidate for inspire implant.  He was recommended a sleep therapist, however he declined.   7. Tobacco use: He smokes cigars.  Full cessation advised.   8. Disposition: Follow-up in 6 months, sooner if needed.   Joylene Grapes, NP 11/26/2023, 5:47 PM

## 2023-11-26 NOTE — Patient Instructions (Addendum)
 Medication Instructions:  Lasix 20 mg daily  Please Start Simvastatin 40 mg daily & Zetia 10 mg daily  *If you need a refill on your cardiac medications before your next appointment, please call your pharmacy*  Lab Work: NONE ordered at this time of appointment   Testing/Procedures: NONE ordered at this time of appointment   Follow-Up: At [facility], you and your health needs are our priority.  As part of our continuing mission to provide you with exceptional heart care, we have created designated Provider Care Teams.  These Care Teams include your primary Cardiologist (physician) and Advanced Practice Providers (APPs -  Physician Assistants and Nurse Practitioners) who all work together to provide you with the care you need, when you need it.  We recommend signing up for the patient portal called "MyChart".  Sign up information is provided on this After Visit Summary.  MyChart is used to connect with patients for Virtual Visits (Telemedicine).  Patients are able to view lab/test results, encounter notes, upcoming appointments, etc.  Non-urgent messages can be sent to your provider as well.   To learn more about what you can do with MyChart, go to [email or web address].    Your next appointment:   6 month(s)  Provider:    Bernadene Person, NP        Other Instructions Monitor BP. Report BP consistently greater than 140/80. Please complete lab work with your PCP.

## 2023-11-30 DIAGNOSIS — Z1212 Encounter for screening for malignant neoplasm of rectum: Secondary | ICD-10-CM | POA: Diagnosis not present

## 2023-11-30 DIAGNOSIS — Z1211 Encounter for screening for malignant neoplasm of colon: Secondary | ICD-10-CM | POA: Diagnosis not present

## 2023-12-16 ENCOUNTER — Ambulatory Visit: Admitting: Nurse Practitioner

## 2024-02-11 DIAGNOSIS — H5053 Vertical heterophoria: Secondary | ICD-10-CM | POA: Diagnosis not present

## 2024-02-11 DIAGNOSIS — H35373 Puckering of macula, bilateral: Secondary | ICD-10-CM | POA: Diagnosis not present

## 2024-02-11 DIAGNOSIS — Z961 Presence of intraocular lens: Secondary | ICD-10-CM | POA: Diagnosis not present

## 2024-02-23 DIAGNOSIS — Z23 Encounter for immunization: Secondary | ICD-10-CM | POA: Diagnosis not present

## 2024-02-23 DIAGNOSIS — E782 Mixed hyperlipidemia: Secondary | ICD-10-CM | POA: Diagnosis not present

## 2024-05-12 ENCOUNTER — Encounter: Payer: Self-pay | Admitting: Nurse Practitioner

## 2024-06-09 DIAGNOSIS — H43813 Vitreous degeneration, bilateral: Secondary | ICD-10-CM | POA: Diagnosis not present

## 2024-06-09 DIAGNOSIS — H35373 Puckering of macula, bilateral: Secondary | ICD-10-CM | POA: Diagnosis not present

## 2024-06-09 DIAGNOSIS — Z961 Presence of intraocular lens: Secondary | ICD-10-CM | POA: Diagnosis not present

## 2024-06-10 DIAGNOSIS — H43813 Vitreous degeneration, bilateral: Secondary | ICD-10-CM | POA: Diagnosis not present

## 2024-06-10 DIAGNOSIS — H35373 Puckering of macula, bilateral: Secondary | ICD-10-CM | POA: Diagnosis not present

## 2024-06-10 DIAGNOSIS — Z961 Presence of intraocular lens: Secondary | ICD-10-CM | POA: Diagnosis not present

## 2024-06-30 DIAGNOSIS — H35371 Puckering of macula, right eye: Secondary | ICD-10-CM | POA: Diagnosis not present

## 2024-06-30 DIAGNOSIS — H35372 Puckering of macula, left eye: Secondary | ICD-10-CM | POA: Diagnosis not present

## 2024-07-07 ENCOUNTER — Telehealth: Payer: Self-pay

## 2024-07-07 NOTE — Telephone Encounter (Signed)
**Note De-Identified Arthur Hardy Obfuscation** Per the [employer] provider portal this Split Night Sleep Study has been approved from 07/07/2024-10/05/2024 Authorization #: [id number]  I have transferred the order to the Sleep Lab.

## 2024-07-15 DIAGNOSIS — Z9889 Other specified postprocedural states: Secondary | ICD-10-CM | POA: Diagnosis not present

## 2024-07-15 DIAGNOSIS — H35373 Puckering of macula, bilateral: Secondary | ICD-10-CM | POA: Diagnosis not present

## 2024-09-13 NOTE — Progress Notes (Signed)
 Arthur Hardy                                          MRN: [id number]   09/13/2024   The [facility] Quality Team Specialist reviewed this patient medical record for the purposes of chart review for care gap closure. The following were reviewed: chart review for care gap closure-controlling blood pressure.    [facility] Quality Team
